# Patient Record
Sex: Female | Born: 1975 | Race: White | Hispanic: No | Marital: Married | Smoking: Never smoker
Health system: Southern US, Community
[De-identification: ages and names within clinical notes are randomized; demographics above are authoritative.]

## PROBLEM LIST (undated history)

## (undated) DIAGNOSIS — E785 Hyperlipidemia, unspecified: Secondary | ICD-10-CM

## (undated) DIAGNOSIS — I10 Essential (primary) hypertension: Secondary | ICD-10-CM

## (undated) DIAGNOSIS — J302 Other seasonal allergic rhinitis: Secondary | ICD-10-CM

## (undated) HISTORY — DX: Essential (primary) hypertension: I10

## (undated) HISTORY — DX: Other seasonal allergic rhinitis: J30.2

## (undated) HISTORY — DX: Hyperlipidemia, unspecified: E78.5

---

## 2009-08-13 ENCOUNTER — Ambulatory Visit: Payer: Self-pay | Admitting: Oncology

## 2009-08-18 LAB — CMP (CANCER CENTER ONLY)
ALT(SGPT): 34 U/L (ref 10–47)
Alkaline Phosphatase: 45 U/L (ref 26–84)
CO2: 31 mEq/L (ref 18–33)
Chloride: 104 mEq/L (ref 98–108)
Creat: 0.6 mg/dl (ref 0.6–1.2)
Sodium: 141 mEq/L (ref 128–145)

## 2009-08-18 LAB — CBC WITH DIFFERENTIAL (CANCER CENTER ONLY)
HCT: 41 % (ref 34.8–46.6)
MCV: 91 fL (ref 81–101)
MONO%: 6.7 % (ref 0.0–13.0)
NEUT#: 2.7 10*3/uL (ref 1.5–6.5)
NEUT%: 64.7 % (ref 39.6–80.0)
RDW: 10.3 % — ABNORMAL LOW (ref 10.5–14.6)
WBC: 4.2 10*3/uL (ref 3.9–10.0)

## 2009-08-18 LAB — PROTIME-INR (CHCC SATELLITE): INR: 0.9 — ABNORMAL LOW (ref 2.0–3.5)

## 2009-08-21 LAB — HYPERCOAGULABLE PANEL, COMPREHENSIVE RET.
Anticardiolipin IgM: 3 MPL U/mL (ref <10)
Beta-2 Glyco I IgG: <3 U/mL (ref <=15)
DRVVT: 36.6 secs (ref 34.7–40.5)
PTT Lupus Anticoagulant: 50.8 secs — ABNORMAL HIGH (ref 32.0–43.4)
PTTLA 4:1 Mix: 43.4 secs (ref 36.3–48.8)
Protein S Ag, Total: 77 % (ref 70–140)

## 2009-09-11 ENCOUNTER — Ambulatory Visit: Payer: Self-pay | Admitting: Oncology

## 2009-09-16 LAB — PROTEIN S ACTIVITY: Protein S Activity: 64 % — ABNORMAL LOW (ref 69–129)

## 2010-02-12 ENCOUNTER — Other Ambulatory Visit: Admission: RE | Admit: 2010-02-12 | Discharge: 2010-02-12 | Payer: Self-pay | Admitting: Gynecology

## 2010-02-12 ENCOUNTER — Ambulatory Visit: Payer: Self-pay | Admitting: Gynecology

## 2010-02-12 ENCOUNTER — Emergency Department (HOSPITAL_COMMUNITY): Admission: EM | Admit: 2010-02-12 | Discharge: 2010-02-12 | Payer: Self-pay | Admitting: Emergency Medicine

## 2010-02-23 ENCOUNTER — Ambulatory Visit: Payer: Self-pay | Admitting: Gynecology

## 2010-04-17 ENCOUNTER — Encounter: Admission: RE | Admit: 2010-04-17 | Discharge: 2010-04-17 | Payer: Self-pay | Admitting: Gynecology

## 2015-03-28 ENCOUNTER — Encounter: Payer: Self-pay | Admitting: Obstetrics and Gynecology

## 2015-03-31 DIAGNOSIS — E785 Hyperlipidemia, unspecified: Secondary | ICD-10-CM | POA: Insufficient documentation

## 2015-03-31 DIAGNOSIS — N943 Premenstrual tension syndrome: Secondary | ICD-10-CM | POA: Insufficient documentation

## 2015-03-31 DIAGNOSIS — Z8249 Family history of ischemic heart disease and other diseases of the circulatory system: Secondary | ICD-10-CM | POA: Insufficient documentation

## 2015-03-31 DIAGNOSIS — R5383 Other fatigue: Secondary | ICD-10-CM | POA: Insufficient documentation

## 2015-06-06 ENCOUNTER — Encounter: Payer: Self-pay | Admitting: Obstetrics and Gynecology

## 2015-06-18 ENCOUNTER — Encounter: Payer: Self-pay | Admitting: Obstetrics and Gynecology

## 2015-07-28 ENCOUNTER — Other Ambulatory Visit: Payer: Self-pay | Admitting: Internal Medicine

## 2015-07-28 DIAGNOSIS — R1011 Right upper quadrant pain: Secondary | ICD-10-CM | POA: Insufficient documentation

## 2015-07-30 ENCOUNTER — Ambulatory Visit
Admission: RE | Admit: 2015-07-30 | Discharge: 2015-07-30 | Disposition: A | Discharge status: Home / Self Care | Payer: BLUE CROSS/BLUE SHIELD | Source: Ambulatory Visit | Attending: Internal Medicine | Admitting: Internal Medicine

## 2015-07-30 DIAGNOSIS — R1011 Right upper quadrant pain: Secondary | ICD-10-CM

## 2015-10-31 ENCOUNTER — Other Ambulatory Visit: Payer: Self-pay | Admitting: Gynecology

## 2015-10-31 DIAGNOSIS — R590 Localized enlarged lymph nodes: Secondary | ICD-10-CM

## 2015-11-03 ENCOUNTER — Other Ambulatory Visit: Payer: Self-pay | Admitting: Gynecology

## 2015-11-03 DIAGNOSIS — R590 Localized enlarged lymph nodes: Secondary | ICD-10-CM

## 2015-11-07 ENCOUNTER — Ambulatory Visit
Admission: RE | Admit: 2015-11-07 | Discharge: 2015-11-07 | Disposition: A | Discharge status: Home / Self Care | Payer: BLUE CROSS/BLUE SHIELD | Source: Ambulatory Visit | Attending: Gynecology | Admitting: Gynecology

## 2015-11-07 ENCOUNTER — Other Ambulatory Visit: Payer: Self-pay | Admitting: Gynecology

## 2015-11-07 DIAGNOSIS — R590 Localized enlarged lymph nodes: Secondary | ICD-10-CM

## 2015-11-12 ENCOUNTER — Other Ambulatory Visit: Payer: Self-pay | Admitting: Gynecology

## 2015-11-12 ENCOUNTER — Ambulatory Visit
Admission: RE | Admit: 2015-11-12 | Discharge: 2015-11-12 | Disposition: A | Discharge status: Home / Self Care | Payer: BLUE CROSS/BLUE SHIELD | Source: Ambulatory Visit | Attending: Gynecology | Admitting: Gynecology

## 2015-11-12 DIAGNOSIS — R590 Localized enlarged lymph nodes: Secondary | ICD-10-CM

## 2016-02-09 DIAGNOSIS — R059 Cough, unspecified: Secondary | ICD-10-CM | POA: Insufficient documentation

## 2016-06-11 ENCOUNTER — Other Ambulatory Visit: Payer: Self-pay | Admitting: Gynecology

## 2016-06-11 DIAGNOSIS — N63 Unspecified lump in unspecified breast: Secondary | ICD-10-CM

## 2016-06-18 ENCOUNTER — Ambulatory Visit
Admission: RE | Admit: 2016-06-18 | Discharge: 2016-06-18 | Disposition: A | Discharge status: Home / Self Care | Payer: BLUE CROSS/BLUE SHIELD | Source: Ambulatory Visit | Attending: Gynecology | Admitting: Gynecology

## 2016-06-18 ENCOUNTER — Other Ambulatory Visit: Payer: Self-pay | Admitting: Gynecology

## 2016-06-18 DIAGNOSIS — N63 Unspecified lump in unspecified breast: Secondary | ICD-10-CM

## 2016-06-22 ENCOUNTER — Other Ambulatory Visit: Payer: BLUE CROSS/BLUE SHIELD

## 2016-06-23 ENCOUNTER — Other Ambulatory Visit (HOSPITAL_COMMUNITY): Payer: Self-pay | Admitting: Obstetrics and Gynecology

## 2016-06-23 DIAGNOSIS — R1011 Right upper quadrant pain: Secondary | ICD-10-CM

## 2016-06-28 ENCOUNTER — Ambulatory Visit (HOSPITAL_COMMUNITY)
Admission: RE | Admit: 2016-06-28 | Discharge: 2016-06-28 | Disposition: A | Discharge status: Home / Self Care | Payer: BLUE CROSS/BLUE SHIELD | Source: Ambulatory Visit | Attending: Obstetrics and Gynecology | Admitting: Obstetrics and Gynecology

## 2016-06-28 DIAGNOSIS — R1011 Right upper quadrant pain: Secondary | ICD-10-CM | POA: Diagnosis present

## 2016-08-23 DIAGNOSIS — Z0000 Encounter for general adult medical examination without abnormal findings: Secondary | ICD-10-CM | POA: Insufficient documentation

## 2016-08-23 DIAGNOSIS — M25519 Pain in unspecified shoulder: Secondary | ICD-10-CM | POA: Insufficient documentation

## 2016-10-06 DIAGNOSIS — S86812A Strain of other muscle(s) and tendon(s) at lower leg level, left leg, initial encounter: Secondary | ICD-10-CM | POA: Diagnosis not present

## 2017-03-09 DIAGNOSIS — M79604 Pain in right leg: Secondary | ICD-10-CM | POA: Diagnosis not present

## 2017-03-09 DIAGNOSIS — M25511 Pain in right shoulder: Secondary | ICD-10-CM | POA: Diagnosis not present

## 2017-03-09 DIAGNOSIS — M6281 Muscle weakness (generalized): Secondary | ICD-10-CM | POA: Diagnosis not present

## 2017-03-09 DIAGNOSIS — M79605 Pain in left leg: Secondary | ICD-10-CM | POA: Diagnosis not present

## 2017-03-14 DIAGNOSIS — M79605 Pain in left leg: Secondary | ICD-10-CM | POA: Diagnosis not present

## 2017-03-14 DIAGNOSIS — M25511 Pain in right shoulder: Secondary | ICD-10-CM | POA: Diagnosis not present

## 2017-03-14 DIAGNOSIS — M6281 Muscle weakness (generalized): Secondary | ICD-10-CM | POA: Diagnosis not present

## 2017-03-14 DIAGNOSIS — M79604 Pain in right leg: Secondary | ICD-10-CM | POA: Diagnosis not present

## 2017-03-22 DIAGNOSIS — M25511 Pain in right shoulder: Secondary | ICD-10-CM | POA: Diagnosis not present

## 2017-03-22 DIAGNOSIS — M6281 Muscle weakness (generalized): Secondary | ICD-10-CM | POA: Diagnosis not present

## 2017-03-22 DIAGNOSIS — M79605 Pain in left leg: Secondary | ICD-10-CM | POA: Diagnosis not present

## 2017-03-22 DIAGNOSIS — M79604 Pain in right leg: Secondary | ICD-10-CM | POA: Diagnosis not present

## 2017-03-29 DIAGNOSIS — M79605 Pain in left leg: Secondary | ICD-10-CM | POA: Diagnosis not present

## 2017-03-29 DIAGNOSIS — M25511 Pain in right shoulder: Secondary | ICD-10-CM | POA: Diagnosis not present

## 2017-03-29 DIAGNOSIS — M6281 Muscle weakness (generalized): Secondary | ICD-10-CM | POA: Diagnosis not present

## 2017-03-29 DIAGNOSIS — M79604 Pain in right leg: Secondary | ICD-10-CM | POA: Diagnosis not present

## 2017-05-03 DIAGNOSIS — M79605 Pain in left leg: Secondary | ICD-10-CM | POA: Diagnosis not present

## 2017-05-03 DIAGNOSIS — M25511 Pain in right shoulder: Secondary | ICD-10-CM | POA: Diagnosis not present

## 2017-05-03 DIAGNOSIS — M79604 Pain in right leg: Secondary | ICD-10-CM | POA: Diagnosis not present

## 2017-05-03 DIAGNOSIS — M6281 Muscle weakness (generalized): Secondary | ICD-10-CM | POA: Diagnosis not present

## 2017-06-24 DIAGNOSIS — Z1389 Encounter for screening for other disorder: Secondary | ICD-10-CM | POA: Diagnosis not present

## 2017-06-24 DIAGNOSIS — Z1231 Encounter for screening mammogram for malignant neoplasm of breast: Secondary | ICD-10-CM | POA: Diagnosis not present

## 2017-06-24 DIAGNOSIS — Z01419 Encounter for gynecological examination (general) (routine) without abnormal findings: Secondary | ICD-10-CM | POA: Diagnosis not present

## 2017-06-24 DIAGNOSIS — Z6821 Body mass index (BMI) 21.0-21.9, adult: Secondary | ICD-10-CM | POA: Diagnosis not present

## 2017-08-08 DIAGNOSIS — M79604 Pain in right leg: Secondary | ICD-10-CM | POA: Diagnosis not present

## 2017-08-08 DIAGNOSIS — M6281 Muscle weakness (generalized): Secondary | ICD-10-CM | POA: Diagnosis not present

## 2017-08-08 DIAGNOSIS — M79605 Pain in left leg: Secondary | ICD-10-CM | POA: Diagnosis not present

## 2017-08-10 DIAGNOSIS — M6281 Muscle weakness (generalized): Secondary | ICD-10-CM | POA: Diagnosis not present

## 2017-08-10 DIAGNOSIS — M79605 Pain in left leg: Secondary | ICD-10-CM | POA: Diagnosis not present

## 2017-08-10 DIAGNOSIS — M79604 Pain in right leg: Secondary | ICD-10-CM | POA: Diagnosis not present

## 2017-08-12 DIAGNOSIS — M6281 Muscle weakness (generalized): Secondary | ICD-10-CM | POA: Diagnosis not present

## 2017-08-12 DIAGNOSIS — M79605 Pain in left leg: Secondary | ICD-10-CM | POA: Diagnosis not present

## 2017-08-12 DIAGNOSIS — M79604 Pain in right leg: Secondary | ICD-10-CM | POA: Diagnosis not present

## 2017-08-16 ENCOUNTER — Encounter: Payer: Self-pay | Admitting: Sports Medicine

## 2017-08-16 ENCOUNTER — Ambulatory Visit (INDEPENDENT_AMBULATORY_CARE_PROVIDER_SITE_OTHER): Payer: BLUE CROSS/BLUE SHIELD | Admitting: Sports Medicine

## 2017-08-16 ENCOUNTER — Ambulatory Visit: Payer: Self-pay

## 2017-08-16 VITALS — BP 100/70 | Ht 66.0 in | Wt 137.0 lb

## 2017-08-16 DIAGNOSIS — M25561 Pain in right knee: Secondary | ICD-10-CM | POA: Insufficient documentation

## 2017-08-16 DIAGNOSIS — M25562 Pain in left knee: Principal | ICD-10-CM

## 2017-08-16 NOTE — Assessment & Plan Note (Signed)
See OV  HEP  ressurance  Some periodic icing  Strap prn

## 2017-08-16 NOTE — Progress Notes (Signed)
   Subjective:    Patient ID: Mallory Ramirez, female    DOB: 1976/09/12, 41 y.o.   MRN: [id number]  HPI 41 yo avid [place] female runner presents today to discuss her bilateral knee pain x 1 month. Pt has been running for decades and participated in several distance races. Most recently she began training for the [employer] run set to take place 08/27/17. She has been running ~25-30 miles a week. Over the past month she developed bilateral anterior dull achy knee pain at the 10 mile mark. She reports stopping training and hobbling off the course do to pain. She has iced, elevated, and rested the knees. Her right knee seems to have improved with the R.I.C.E regimen but the pain in the left knee remains. She is afraid to train and seeking advice today.    Review of Systems Musculoskeletal: No weakness/ no lockng/ no giving way/ no swelling Neurovascular: Denies numbness, pallor, or paresthesias     Objective:   Physical Exam  BP 100/70   Ht  (1.676 m)   Wt 137 lb (62.1 kg)   BMI 22.11 kg/m   Gen: no apparent distress, pleasant Resp: no labored breathing Gait: Neutral strike, mild pronation left Bilateral knee:  Inspection: no effusion, bruising, rashes, or lesions noted, left patella lays slightly higher and lateral when compared to the right Palpation: mild tenderness over the left tibial plateau, no tenderness over suprapatellar, patella, infrapatella, joint lines, MCL, LCL, or popliteal region ROM: -5 extension, +135 degree flexion, 30 degree internal rotation of hip, 45 degree external rotation of hip Strength: 5/5 strength on knee extension/flexion, hip adduction.abduction, & gluteus muscles Neurovascular: sensation and pulses normal throughout Special testing: Negative valgus/varus stress, Lachman's, Drawer, Pivot shift, bounce, McMurray, & Noble's.     Ultrasound Left knee Suprapatellar region: no effusion, normal quad tendon Infrapatellar: no effusion, normal patellar  tendon Medial meniscus: intact no thinning noted, no osteophytes noted; normal Lateral meniscus: intact no thinning noted, no osteophytes noted; normal Trochlear groove appears with good cartilage and no abnormality bilaterally Findings on RT were normal as well by comparison views  Impression: Ultrasound does not show any significant abnormalities  Ultrasound and interpretation by Sibyl Parr. Caitland Porchia, MD      Assessment & Plan:  Left knee pain No abnormalities noted on ultrasound today that would sideline her running. However, she would benefit from quad strengthening exercises. Pt instructed and educated on exercises to perform to help strength her quads. Specifically decline squats and one-leg squats, both to 30 degrees of knee flexion as that is the amount of flexion she is likely to get on her runs. Pt was advised to ease back into her runs and to use her pain as a guide. Return to clinic as needed if pain does not resolve.   Try patellar strap on left for comfort if she wants to race soon

## 2017-08-17 DIAGNOSIS — M79605 Pain in left leg: Secondary | ICD-10-CM | POA: Diagnosis not present

## 2017-08-17 DIAGNOSIS — M6281 Muscle weakness (generalized): Secondary | ICD-10-CM | POA: Diagnosis not present

## 2017-08-17 DIAGNOSIS — M79604 Pain in right leg: Secondary | ICD-10-CM | POA: Diagnosis not present

## 2017-09-10 DIAGNOSIS — Z23 Encounter for immunization: Secondary | ICD-10-CM | POA: Diagnosis not present

## 2017-09-14 DIAGNOSIS — M79604 Pain in right leg: Secondary | ICD-10-CM | POA: Diagnosis not present

## 2017-09-14 DIAGNOSIS — M6281 Muscle weakness (generalized): Secondary | ICD-10-CM | POA: Diagnosis not present

## 2017-09-14 DIAGNOSIS — M79605 Pain in left leg: Secondary | ICD-10-CM | POA: Diagnosis not present

## 2017-09-19 DIAGNOSIS — M6281 Muscle weakness (generalized): Secondary | ICD-10-CM | POA: Diagnosis not present

## 2017-09-19 DIAGNOSIS — M79605 Pain in left leg: Secondary | ICD-10-CM | POA: Diagnosis not present

## 2017-09-19 DIAGNOSIS — M79604 Pain in right leg: Secondary | ICD-10-CM | POA: Diagnosis not present

## 2017-09-26 DIAGNOSIS — M79604 Pain in right leg: Secondary | ICD-10-CM | POA: Diagnosis not present

## 2017-09-26 DIAGNOSIS — M6281 Muscle weakness (generalized): Secondary | ICD-10-CM | POA: Diagnosis not present

## 2017-09-26 DIAGNOSIS — M79605 Pain in left leg: Secondary | ICD-10-CM | POA: Diagnosis not present

## 2017-10-03 DIAGNOSIS — M79604 Pain in right leg: Secondary | ICD-10-CM | POA: Diagnosis not present

## 2017-10-03 DIAGNOSIS — M6281 Muscle weakness (generalized): Secondary | ICD-10-CM | POA: Diagnosis not present

## 2017-10-03 DIAGNOSIS — M79605 Pain in left leg: Secondary | ICD-10-CM | POA: Diagnosis not present

## 2017-10-10 DIAGNOSIS — Z0000 Encounter for general adult medical examination without abnormal findings: Secondary | ICD-10-CM | POA: Diagnosis not present

## 2017-10-10 DIAGNOSIS — R82998 Other abnormal findings in urine: Secondary | ICD-10-CM | POA: Diagnosis not present

## 2017-10-10 DIAGNOSIS — E785 Hyperlipidemia, unspecified: Secondary | ICD-10-CM | POA: Diagnosis not present

## 2017-10-11 DIAGNOSIS — M6281 Muscle weakness (generalized): Secondary | ICD-10-CM | POA: Diagnosis not present

## 2017-10-11 DIAGNOSIS — M79604 Pain in right leg: Secondary | ICD-10-CM | POA: Diagnosis not present

## 2017-10-11 DIAGNOSIS — M79605 Pain in left leg: Secondary | ICD-10-CM | POA: Diagnosis not present

## 2017-10-17 DIAGNOSIS — R3121 Asymptomatic microscopic hematuria: Secondary | ICD-10-CM | POA: Insufficient documentation

## 2018-01-23 DIAGNOSIS — M79604 Pain in right leg: Secondary | ICD-10-CM | POA: Diagnosis not present

## 2018-01-23 DIAGNOSIS — M79601 Pain in right arm: Secondary | ICD-10-CM | POA: Diagnosis not present

## 2018-02-06 DIAGNOSIS — M79601 Pain in right arm: Secondary | ICD-10-CM | POA: Diagnosis not present

## 2018-02-06 DIAGNOSIS — M79604 Pain in right leg: Secondary | ICD-10-CM | POA: Diagnosis not present

## 2018-02-09 DIAGNOSIS — M79601 Pain in right arm: Secondary | ICD-10-CM | POA: Diagnosis not present

## 2018-02-09 DIAGNOSIS — M79604 Pain in right leg: Secondary | ICD-10-CM | POA: Diagnosis not present

## 2018-03-01 DIAGNOSIS — M79604 Pain in right leg: Secondary | ICD-10-CM | POA: Diagnosis not present

## 2018-03-01 DIAGNOSIS — M79601 Pain in right arm: Secondary | ICD-10-CM | POA: Diagnosis not present

## 2018-03-28 DIAGNOSIS — R1032 Left lower quadrant pain: Secondary | ICD-10-CM | POA: Diagnosis not present

## 2018-03-28 DIAGNOSIS — Z6821 Body mass index (BMI) 21.0-21.9, adult: Secondary | ICD-10-CM | POA: Diagnosis not present

## 2018-04-04 DIAGNOSIS — R1032 Left lower quadrant pain: Secondary | ICD-10-CM | POA: Diagnosis not present

## 2018-04-05 ENCOUNTER — Other Ambulatory Visit: Payer: Self-pay | Admitting: Internal Medicine

## 2018-04-05 ENCOUNTER — Ambulatory Visit
Admission: RE | Admit: 2018-04-05 | Discharge: 2018-04-05 | Disposition: A | Discharge status: Home / Self Care | Payer: BLUE CROSS/BLUE SHIELD | Source: Ambulatory Visit | Attending: Internal Medicine | Admitting: Internal Medicine

## 2018-04-05 DIAGNOSIS — R1032 Left lower quadrant pain: Secondary | ICD-10-CM | POA: Diagnosis not present

## 2018-04-05 MED ORDER — IOPAMIDOL (ISOVUE-300) INJECTION 61%
100.0000 mL | Freq: Once | INTRAVENOUS | Status: AC | PRN
  Administered 2018-04-05: 100 mL via INTRAVENOUS

## 2018-04-07 DIAGNOSIS — R102 Pelvic and perineal pain: Secondary | ICD-10-CM | POA: Diagnosis not present

## 2018-05-02 DIAGNOSIS — Z01419 Encounter for gynecological examination (general) (routine) without abnormal findings: Secondary | ICD-10-CM | POA: Diagnosis not present

## 2018-05-02 DIAGNOSIS — Z6822 Body mass index (BMI) 22.0-22.9, adult: Secondary | ICD-10-CM | POA: Diagnosis not present

## 2018-06-20 DIAGNOSIS — M6281 Muscle weakness (generalized): Secondary | ICD-10-CM | POA: Diagnosis not present

## 2018-06-20 DIAGNOSIS — M79604 Pain in right leg: Secondary | ICD-10-CM | POA: Diagnosis not present

## 2018-09-19 DIAGNOSIS — S93402D Sprain of unspecified ligament of left ankle, subsequent encounter: Secondary | ICD-10-CM | POA: Diagnosis not present

## 2018-09-20 DIAGNOSIS — S93402D Sprain of unspecified ligament of left ankle, subsequent encounter: Secondary | ICD-10-CM | POA: Diagnosis not present

## 2018-09-23 IMAGING — CT CT ABD-PELV W/ CM
1 of 3 series · 14 of 32 positions shown, 19 images · IV contrast (APPLIED)
Comparison: None.

CLINICAL DATA: Left lower quadrant abdominal pain

EXAM:
CT ABDOMEN AND PELVIS WITH CONTRAST
TECHNIQUE: Multidetector CT imaging of the abdomen and pelvis was performed
using the standard protocol following bolus administration of
intravenous contrast.
CONTRAST:  100mL 8VRDHX-B00 IOPAMIDOL (8VRDHX-B00) INJECTION 61%

[Series 2: abd/pelvis w/cm · axial · 0.77mm/px · z∈[-426,-31]mm · 14 of 91 slices shown, 19 images]
[im 6/91  soft-tissue]
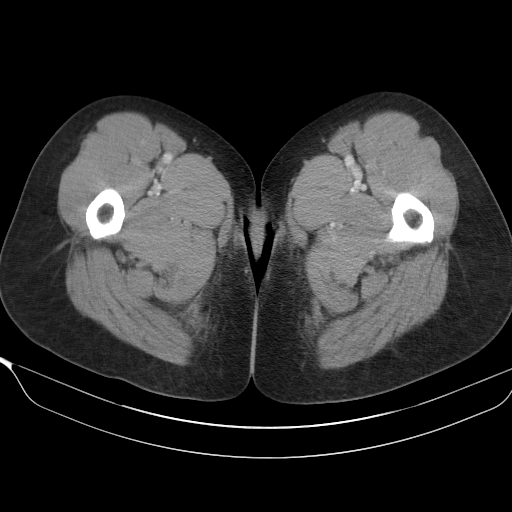
[im 6/91  bone]
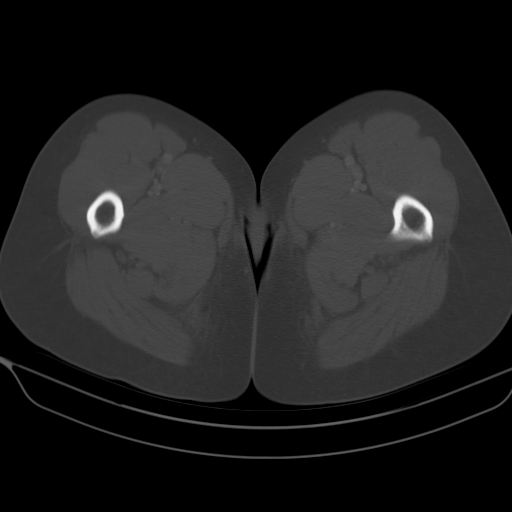
[im 12/91  soft-tissue]
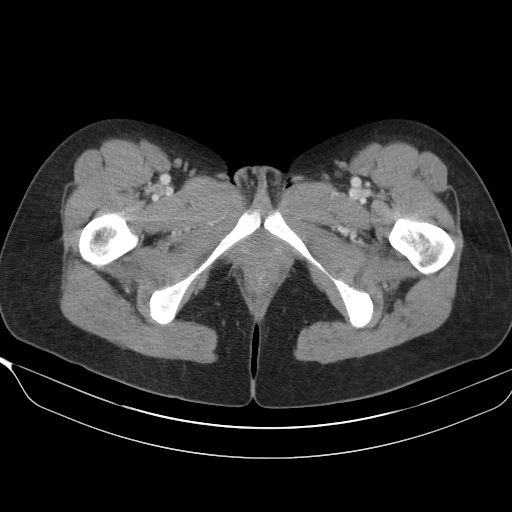
[im 17/91  soft-tissue]
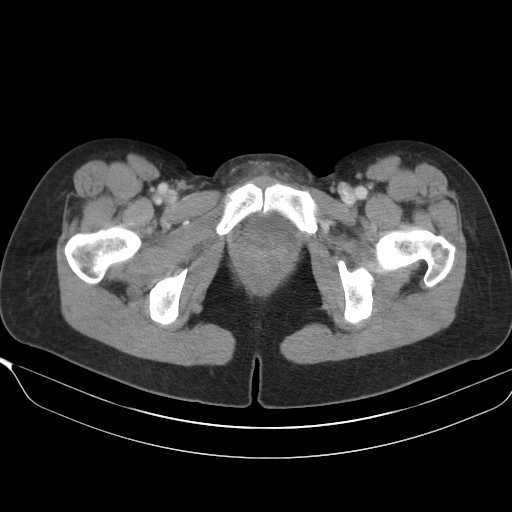
[im 29/91  soft-tissue]
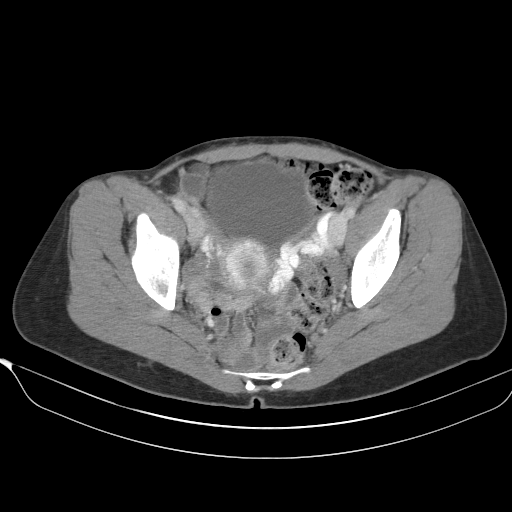
[im 34/91  soft-tissue]
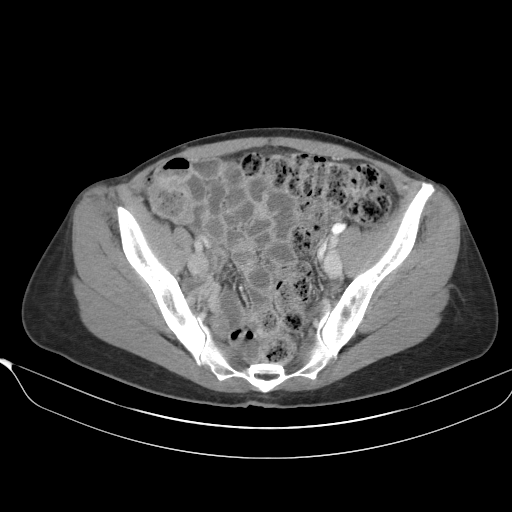
[im 40/91  soft-tissue]
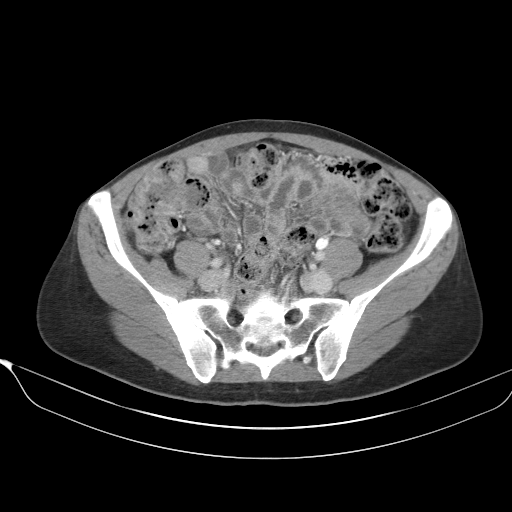
[im 46/91  soft-tissue]
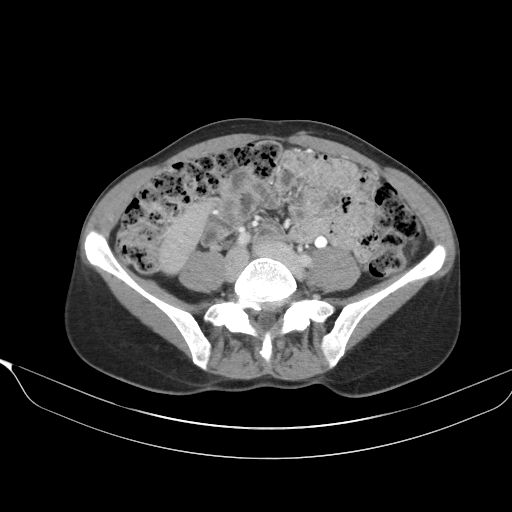
[im 51/91  soft-tissue]
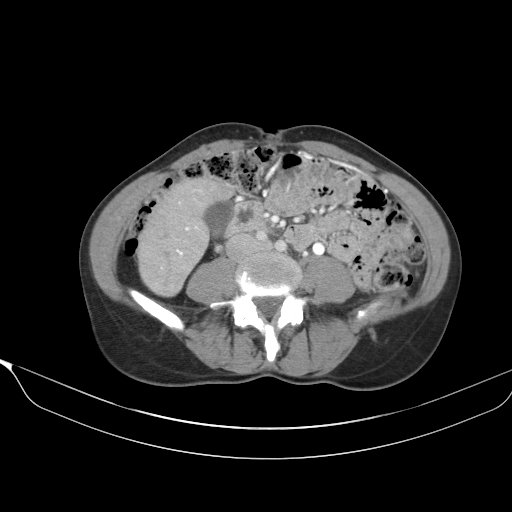
[im 57/91  soft-tissue]
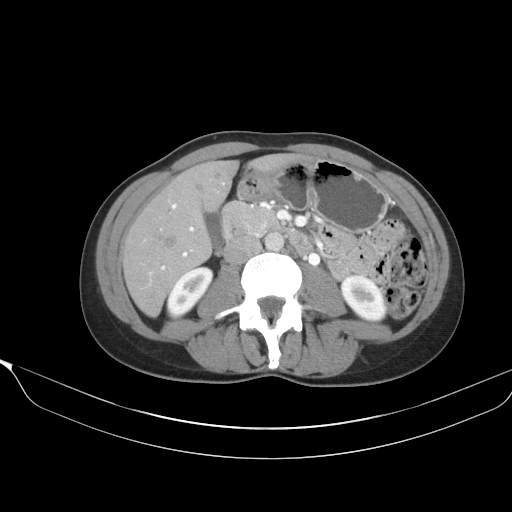
[im 57/91  bone]
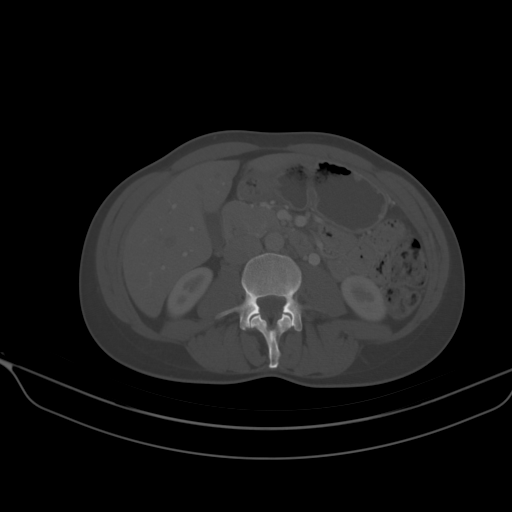
[im 62/91  soft-tissue]
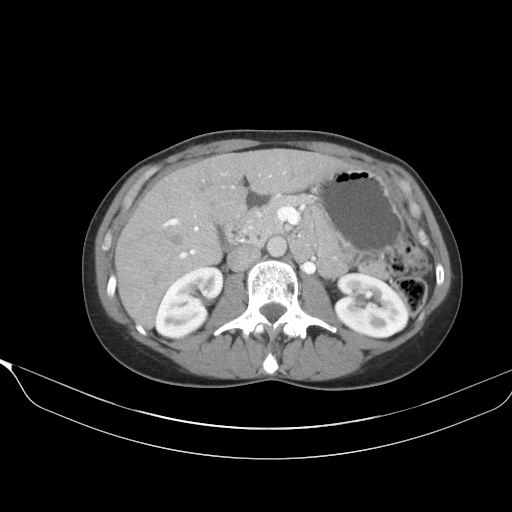
[im 68/91  lung]
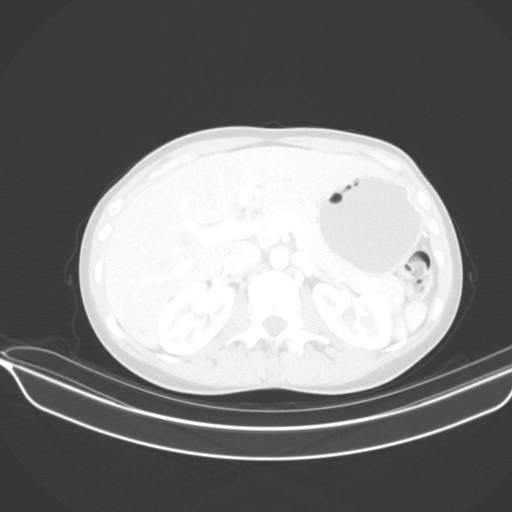
[im 74/91  soft-tissue]
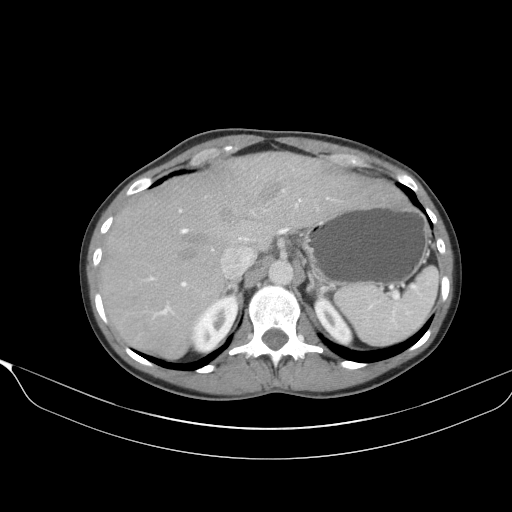
[im 74/91  lung]
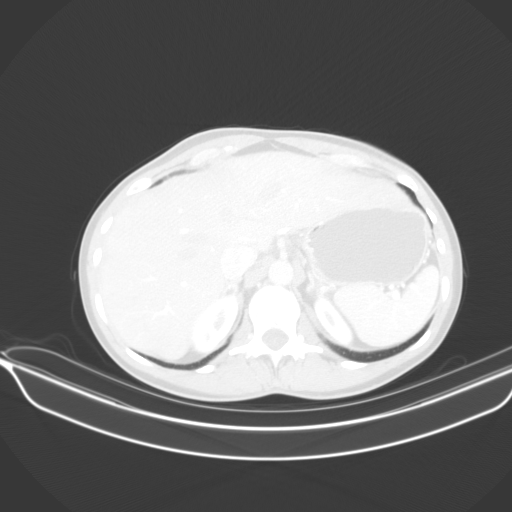
[im 79/91  soft-tissue]
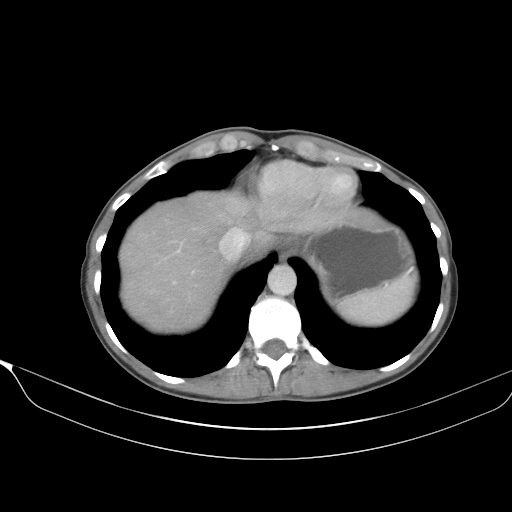
[im 79/91  lung]
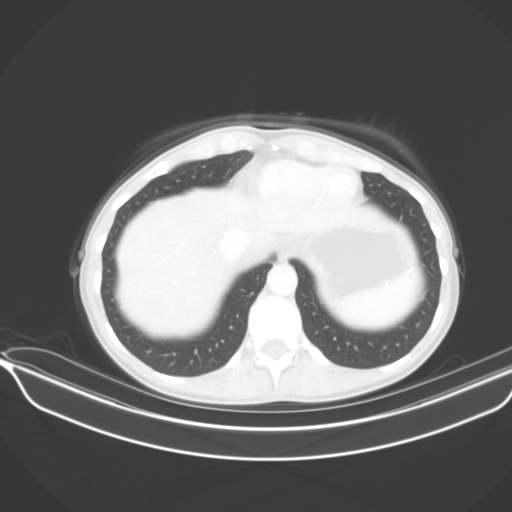
[im 85/91  soft-tissue]
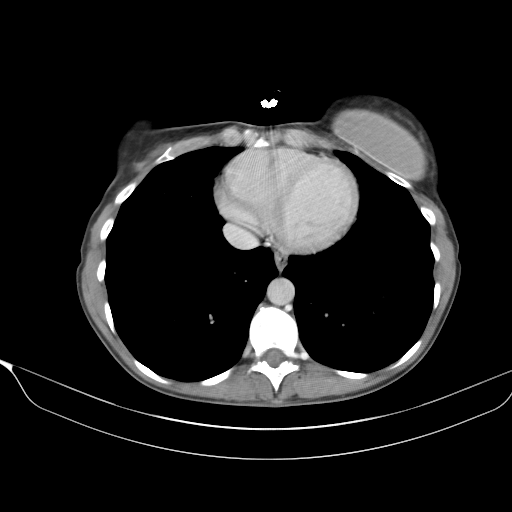
[im 85/91  lung]
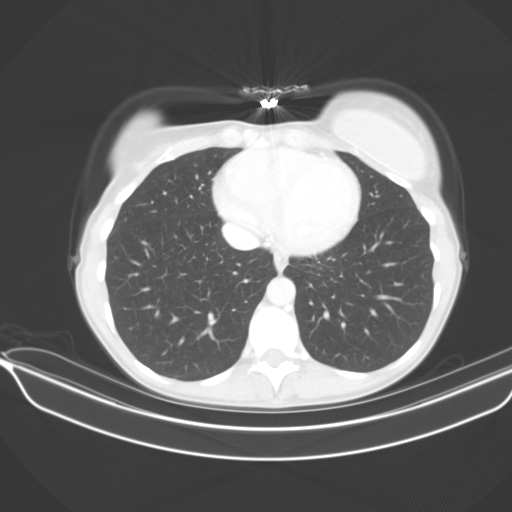

[14 of 32 positions shown; findings below may reference images not displayed]

FINDINGS: Lower chest: Negative

Hepatobiliary: Normal liver.  Gallbladder and bile ducts normal.

Pancreas: Negative

Spleen: Negative

Adrenals/Urinary Tract: Normal kidneys. No renal mass or obstruction
or stone. Normal bladder.

Stomach/Bowel: Moderate to large amount of stool in the colon.
Negative for bowel obstruction. Negative for bowel mass or edema.
Appendix not visualized. Negative for diverticulitis.

Vascular/Lymphatic: Prominent left gonadal vein, measuring 10 mm in
diameter.. Prominent adnexal vessels, left greater than right.
Negative for atherosclerotic disease. No adenopathy.

Reproductive: No pelvic mass.

Other: No free fluid

Musculoskeletal: No acute abnormality. Central disc protrusion
L5-S1.
IMPRESSION: 1. Large amount of stool in the colon without bowel obstruction or
bowel edema
2. Pelvic venous congestion with dilated adnexal veins left greater
than right and enlarged left gonadal vein.

## 2018-09-26 DIAGNOSIS — S93402D Sprain of unspecified ligament of left ankle, subsequent encounter: Secondary | ICD-10-CM | POA: Diagnosis not present

## 2018-09-28 ENCOUNTER — Ambulatory Visit (INDEPENDENT_AMBULATORY_CARE_PROVIDER_SITE_OTHER): Payer: Self-pay

## 2018-09-28 ENCOUNTER — Ambulatory Visit (INDEPENDENT_AMBULATORY_CARE_PROVIDER_SITE_OTHER): Payer: Self-pay | Admitting: Surgery

## 2018-09-28 ENCOUNTER — Encounter (INDEPENDENT_AMBULATORY_CARE_PROVIDER_SITE_OTHER): Payer: Self-pay | Admitting: Surgery

## 2018-09-28 ENCOUNTER — Ambulatory Visit (INDEPENDENT_AMBULATORY_CARE_PROVIDER_SITE_OTHER): Payer: BLUE CROSS/BLUE SHIELD | Admitting: Surgery

## 2018-09-28 DIAGNOSIS — M25572 Pain in left ankle and joints of left foot: Secondary | ICD-10-CM | POA: Diagnosis not present

## 2018-09-28 DIAGNOSIS — S93402D Sprain of unspecified ligament of left ankle, subsequent encounter: Secondary | ICD-10-CM | POA: Diagnosis not present

## 2018-10-05 ENCOUNTER — Ambulatory Visit (INDEPENDENT_AMBULATORY_CARE_PROVIDER_SITE_OTHER): Payer: Self-pay | Admitting: Surgery

## 2018-10-05 DIAGNOSIS — S93402D Sprain of unspecified ligament of left ankle, subsequent encounter: Secondary | ICD-10-CM | POA: Diagnosis not present

## 2018-10-06 DIAGNOSIS — Z23 Encounter for immunization: Secondary | ICD-10-CM | POA: Diagnosis not present

## 2018-10-09 DIAGNOSIS — S93402D Sprain of unspecified ligament of left ankle, subsequent encounter: Secondary | ICD-10-CM | POA: Diagnosis not present

## 2018-10-09 NOTE — Progress Notes (Signed)
Office Visit Note   Patient: Mallory Ramirez           Date of Birth: June 23, 1976           MRN: [id number] Visit Date: 09/28/2018              Requested by: Rodrigo RanPerini, Mark, MD [address] [place], [place] [id number] PCP: Rodrigo RanPerini, Mark, MD   Assessment & Plan: Visit Diagnoses:  1. Pain in left ankle and joints of left foot     Plan: Advised patient that I recommend that she hold off on participating in her distance run that she has upcoming to let her ankle completely healed.  I did advise patient that she could either delay the healing process or cause further injury if she is noncompliant.  She voices understanding.  Can ice off and on as needed and can use over-the-counter NSAID. Marland Kitchen.  She does have a lace up brace that she can continue to use.  I do not think she needs a cam boot at this point.    Follow-Up Instructions: Return in about 2 weeks (around 10/12/2018) for with Khylah Kendra recheck ankle. .   Orders:  Orders Placed This Encounter  Procedures  . XR Ankle Complete Left   No orders of the defined types were placed in this encounter.     Procedures: No procedures performed   Clinical Data: No additional findings.   Subjective: Chief Complaint  Patient presents with  . Left Ankle - Pain    HPI 42 year old female comes in today with complaints of left lateral ankle pain.  Patient had been a runner and while jogging about 2 weeks ago she stepped into a pothole suffered an inversion type injury.  She has been training for marathon the last several months and is due to competing this in 2 days.  She has gone to see a physical therapist here in town that she knows and has had treatment.  She has not seen a healthcare provider for her ankle problem.  He has been trying conservative treatment with lace up ankle brace, icing off and on.  States that she is also continue to run although she does have pain but not as bad as onset. Review of Systems No current cardiac pulmonary GI  GU issues  Objective: Vital Signs: There were no vitals taken for this visit.  Physical Exam  Constitutional: She is oriented to person, place, and time. She appears well-developed and well-nourished. No distress.  HENT:  Head: Normocephalic and atraumatic.  Eyes: Pupils are equal, round, and reactive to light. EOM are normal.  Neck: Normal range of motion.  Pulmonary/Chest: No respiratory distress.  Musculoskeletal:  Gait is minimally antalgic.  Left ankle she does continue to have some residual bruising lateral aspect ankle and lateral foot.  Does have mild swelling around the ATFL where she is also moderately tender.  Ankle ligaments are stable.  Patient is also tender along the peroneal tendon lateral foot.  Achilles tendon nontender.  Neurovascular intact.  Calf nontender.  Neurological: She is alert and oriented to person, place, and time.  Skin: Skin is warm and dry.    Ortho Exam  Specialty Comments:  No specialty comments available.  Imaging: No results found.   PMFS History: Patient Active Problem List   Diagnosis Date Noted  . Knee pain, bilateral 08/16/2017   History reviewed. No pertinent past medical history.  History reviewed. No pertinent family history.  History reviewed. No pertinent surgical history.  Social History   Occupational History  . Not on file  Tobacco Use  . Smoking status: Never Smoker  . Smokeless tobacco: Never Used  Substance and Sexual Activity  . Alcohol use: Not on file  . Drug use: Not on file  . Sexual activity: Not on file

## 2018-10-19 DIAGNOSIS — Z1231 Encounter for screening mammogram for malignant neoplasm of breast: Secondary | ICD-10-CM | POA: Diagnosis not present

## 2018-11-01 DIAGNOSIS — S93402D Sprain of unspecified ligament of left ankle, subsequent encounter: Secondary | ICD-10-CM | POA: Diagnosis not present

## 2018-11-03 DIAGNOSIS — S93402D Sprain of unspecified ligament of left ankle, subsequent encounter: Secondary | ICD-10-CM | POA: Diagnosis not present

## 2018-11-15 HISTORY — PX: ROTATOR CUFF REPAIR: SHX139

## 2018-12-12 DIAGNOSIS — E7849 Other hyperlipidemia: Secondary | ICD-10-CM | POA: Diagnosis not present

## 2018-12-12 DIAGNOSIS — R82998 Other abnormal findings in urine: Secondary | ICD-10-CM | POA: Diagnosis not present

## 2018-12-12 DIAGNOSIS — Z0000 Encounter for general adult medical examination without abnormal findings: Secondary | ICD-10-CM | POA: Diagnosis not present

## 2018-12-20 DIAGNOSIS — E7849 Other hyperlipidemia: Secondary | ICD-10-CM | POA: Diagnosis not present

## 2018-12-20 DIAGNOSIS — N943 Premenstrual tension syndrome: Secondary | ICD-10-CM | POA: Diagnosis not present

## 2018-12-20 DIAGNOSIS — D72819 Decreased white blood cell count, unspecified: Secondary | ICD-10-CM | POA: Insufficient documentation

## 2018-12-20 DIAGNOSIS — L84 Corns and callosities: Secondary | ICD-10-CM | POA: Insufficient documentation

## 2018-12-20 DIAGNOSIS — Z8249 Family history of ischemic heart disease and other diseases of the circulatory system: Secondary | ICD-10-CM | POA: Diagnosis not present

## 2018-12-20 DIAGNOSIS — Z23 Encounter for immunization: Secondary | ICD-10-CM | POA: Diagnosis not present

## 2018-12-20 DIAGNOSIS — Z0000 Encounter for general adult medical examination without abnormal findings: Secondary | ICD-10-CM | POA: Diagnosis not present

## 2018-12-20 DIAGNOSIS — I7300 Raynaud's syndrome without gangrene: Secondary | ICD-10-CM | POA: Insufficient documentation

## 2018-12-20 DIAGNOSIS — Z1331 Encounter for screening for depression: Secondary | ICD-10-CM | POA: Diagnosis not present

## 2018-12-20 DIAGNOSIS — R011 Cardiac murmur, unspecified: Secondary | ICD-10-CM | POA: Insufficient documentation

## 2018-12-20 DIAGNOSIS — M25511 Pain in right shoulder: Secondary | ICD-10-CM | POA: Diagnosis not present

## 2018-12-21 ENCOUNTER — Other Ambulatory Visit (HOSPITAL_COMMUNITY): Payer: Self-pay | Admitting: Internal Medicine

## 2018-12-21 ENCOUNTER — Other Ambulatory Visit: Payer: Self-pay | Admitting: Internal Medicine

## 2018-12-21 DIAGNOSIS — R011 Cardiac murmur, unspecified: Secondary | ICD-10-CM

## 2018-12-25 ENCOUNTER — Ambulatory Visit (HOSPITAL_COMMUNITY): Payer: BLUE CROSS/BLUE SHIELD | Attending: Cardiology

## 2018-12-25 DIAGNOSIS — R011 Cardiac murmur, unspecified: Secondary | ICD-10-CM | POA: Insufficient documentation

## 2019-01-09 ENCOUNTER — Ambulatory Visit: Payer: BLUE CROSS/BLUE SHIELD | Admitting: Sports Medicine

## 2019-01-11 ENCOUNTER — Encounter: Payer: Self-pay | Admitting: Podiatry

## 2019-01-11 ENCOUNTER — Ambulatory Visit: Payer: BLUE CROSS/BLUE SHIELD | Admitting: Podiatry

## 2019-01-11 ENCOUNTER — Ambulatory Visit (INDEPENDENT_AMBULATORY_CARE_PROVIDER_SITE_OTHER): Payer: BLUE CROSS/BLUE SHIELD

## 2019-01-11 VITALS — BP 107/64

## 2019-01-11 DIAGNOSIS — B070 Plantar wart: Secondary | ICD-10-CM

## 2019-01-11 DIAGNOSIS — M79671 Pain in right foot: Secondary | ICD-10-CM | POA: Diagnosis not present

## 2019-01-11 DIAGNOSIS — S90851A Superficial foreign body, right foot, initial encounter: Secondary | ICD-10-CM

## 2019-01-11 NOTE — Patient Instructions (Signed)

## 2019-01-11 NOTE — Progress Notes (Signed)
Subjective:   Patient ID: Mallory Ramirez, female   DOB: 43 y.o.   MRN: [id number]   HPI Patient presents with painful lesion underneath the right big toe and states is been present for about 3 months and it is painful with activity with burning also noted.  Patient does not smoke and likes to be active   Review of Systems  All other systems reviewed and are negative.       Objective:  Physical Exam Vitals signs and nursing note reviewed.  Constitutional:      Appearance: She is well-developed.  Pulmonary:     Effort: Pulmonary effort is normal.  Musculoskeletal: Normal range of motion.  Skin:    General: Skin is warm.  Neurological:     Mental Status: She is alert.     Neurovascular status intact muscle strength is adequate with large lesion plantar aspect right measuring about 2.1 cm x 1.2 cm that upon debridement is painful with lateral pressure especially painful and pinpoint bleeding noted     Assessment:  Verruca plantaris plantar aspect right hallux that is solitary and confined     Plan:  H&P condition reviewed and I recommended surgical excision.  I explained the procedure and risk patient wants surgery and today I went ahead and I anesthetized 60 mg like Marcaine mixture sterile prep applied to the toe and using sterile instrumentation I excised the mass in its entirety I exposed the base and applied a small amount of phenol with sterile dressing and applied thick type dressing to take pressure off the toe.  I did send off for pathological evaluation and instructed on soaks and patient will be seen back in the next several weeks or earlier if any issues should occur

## 2019-03-27 ENCOUNTER — Other Ambulatory Visit: Payer: Self-pay | Admitting: Podiatry

## 2019-03-27 DIAGNOSIS — S90851A Superficial foreign body, right foot, initial encounter: Secondary | ICD-10-CM

## 2019-04-12 DIAGNOSIS — D72819 Decreased white blood cell count, unspecified: Secondary | ICD-10-CM | POA: Diagnosis not present

## 2019-05-07 DIAGNOSIS — M6281 Muscle weakness (generalized): Secondary | ICD-10-CM | POA: Diagnosis not present

## 2019-05-07 DIAGNOSIS — M25571 Pain in right ankle and joints of right foot: Secondary | ICD-10-CM | POA: Diagnosis not present

## 2019-05-07 DIAGNOSIS — M25511 Pain in right shoulder: Secondary | ICD-10-CM | POA: Diagnosis not present

## 2019-05-15 ENCOUNTER — Ambulatory Visit: Payer: BC Managed Care – PPO | Admitting: Sports Medicine

## 2019-05-15 ENCOUNTER — Encounter: Payer: Self-pay | Admitting: Sports Medicine

## 2019-05-15 ENCOUNTER — Other Ambulatory Visit: Payer: Self-pay

## 2019-05-15 ENCOUNTER — Ambulatory Visit: Payer: Self-pay

## 2019-05-15 ENCOUNTER — Encounter

## 2019-05-15 VITALS — BP 110/80 | Ht 66.0 in | Wt 134.0 lb

## 2019-05-15 DIAGNOSIS — S96919A Strain of unspecified muscle and tendon at ankle and foot level, unspecified foot, initial encounter: Secondary | ICD-10-CM | POA: Insufficient documentation

## 2019-05-15 DIAGNOSIS — M25372 Other instability, left ankle: Secondary | ICD-10-CM | POA: Diagnosis not present

## 2019-05-15 DIAGNOSIS — M25511 Pain in right shoulder: Secondary | ICD-10-CM | POA: Diagnosis not present

## 2019-05-15 DIAGNOSIS — M722 Plantar fascial fibromatosis: Secondary | ICD-10-CM | POA: Diagnosis not present

## 2019-05-15 DIAGNOSIS — M75101 Unspecified rotator cuff tear or rupture of right shoulder, not specified as traumatic: Secondary | ICD-10-CM | POA: Diagnosis not present

## 2019-05-15 MED ORDER — NITROGLYCERIN 0.2 MG/HR TD PT24: MEDICATED_PATCH | TRANSDERMAL | 1 refills | Status: DC

## 2019-05-15 NOTE — Assessment & Plan Note (Addendum)
Patient was counseled to continue icing the area for about 10 minutes daily and was given several exercises to stretch the plantar fascia.  She will perform these daily.  She will also use sports insoles for a trail  Ultimately may need custom orthotics

## 2019-05-15 NOTE — Progress Notes (Signed)
Subjective:    Mallory Ramirez - 43 y.o. female MRN [id number]  Date of birth: [birth year]  CC:  Mallory Ramirez is here for chronic right shoulder pain, right foot pain, and left ankle pain.  HPI: Chronic right shoulder pain Patient reports that she played tennis frequently in her teens and thinks that she hurt her shoulder while doing overhead serves at some point.  She has had shoulder pain since her teens and this seems to be getting worse lately.  She has pain doing any overhead activities and struggles to find a comfortable position during the night.  Her pain is located on her anterolateral right shoulder.  She has no neurologic symptoms.  Right foot pain Patient says that she has had right foot pain for about the last 4 weeks with an acute worsening about 1 week ago during a run in which she had to stop and limp back home.  She has some pain when she gets out of bed and stands up each morning, but running exacerbates it the most.  Left ankle pain During the run last year, [name] twisted her left ankle.  She was able to run a marathon about 2 weeks later, but since then she has felt that her ankle has felt less stable than normal, especially in the area posterior to the lateral malleolus.  She will sometimes hear a "pop" but does not feel pain when this occurs.  Health Maintenance:  Health Maintenance Due  Topic Date Due  . HIV Screening  07/28/1989  . TETANUS/TDAP  07/28/1993  . PAP SMEAR-Modifier  07/29/1995    -  reports that she has never smoked. She has never used smokeless tobacco. - Review of Systems: Per HPI. - Past Medical History: Patient Active Problem List   Diagnosis Date Noted  . Knee pain, bilateral 08/16/2017   - Medications: reviewed and updated   Objective:   Physical Exam BP 110/80   Ht 5\' 6"  (1.676 m)   Wt 134 lb (60.8 kg)   BMI 21.63 kg/m  Gen: NAD, alert, cooperative with exam, well-appearing Shoulder, right: No evidence of bony deformity,  asymmetry, or muscle atrophy; Mild tenderness over long head of biceps (bicipital groove). No TTP at [facility] joint. Active and passive ROM is reduced on forward flexion (90 degrees) and abduction (90 degrees) due to pain. Unable to reach spine with thumb. Strength 4/5 on elevation and abduction past 90 degrees but normal on external and internal rotation.  No abnormal scapular function observed. Sensation intact. Peripheral pulses intact.  Special Tests:   - Crossarm test: POS   - Empty can: POS   - Hawkins: POS   - Neer test: POS   - Obrien's test: POS   - Yergason's: NEG   - Speeds test: NEG Right foot: Normal appearance, tenderness to palpation on the plantar aspect of the foot just medial to the midline along the longitudinal arch.  Left ankle: Increased laxity noted on anterior drawer with palpable popping of the subtalar joint  Gait analysis: Right foot lands in supination and has intoeing especially with running.  Gait otherwise normal.  Ultrasound of Right Shoulder  BT short: normal appearance, no tears, no tenosynovitis BT long: normal appearance, intact without tears Supraspinatus tendon: Chronic partial tear noted with associated fluid and calcification This is retracted by 1.6 cms Subscapularis tendon: normal appearance without tears or degenerative changes Infraspinatus tendon:  normal appearance without tears or degenerative changes Teres Minor tendon:  normal appearance without  tears or degenerative changes GH joint:  posterior labrum visualized with slight irregularity AC joint: No degenerative changes  Summary and 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 Fields, MD       Assessment & Plan:   See Tear of supraspinatus   Lezlie OctaveAmanda Winfrey, M.D. 05/15/2019, 10:43 AM PGY-2, [facility]  I observed and examined the patient with the resident and agree with assessment and plan.  Note reviewed  and modified by me. Enid BaasKarl Fields, MD

## 2019-05-15 NOTE — Assessment & Plan Note (Signed)
We will plan to start her on some balance and strength exercises

## 2019-05-15 NOTE — Assessment & Plan Note (Signed)
Patient likely sustained a partial supraspinatus tendon tear when her shoulder began hurting her during her years playing tennis as a teenager.  She may be able to recuperate this with strengthening exercises focusing on the supraspinatus muscle.  Patient was given a prescription for nitroglycerin patch as well and counseled on the most common side effect of headache with use.  Several exercises were described for the patient, and she was given a handout.  These exercises include shoulder flexion and abduction up to 90 degrees with a 3 pound dumbbell, with progression beyond 90 degrees if tolerated after about 2 weeks.  We would like to see her again in about 6 weeks to check her progress.

## 2019-05-15 NOTE — Patient Instructions (Addendum)
It was nice seeing you today Ms. Pedroso!  For your right shoulder pain, please complete 15 repetitions of the shoulder exercises we described to you with 3 pound weights.  Remember to stop at shoulder height and to only go above that in a few weeks if you are able to tolerate it.  We will also try a nitroglycerin patch which she will place on the area of the shoulder that is hurting.  We have described how to use the nitroglycerin patch below.  For your plantar fasciitis, we are giving you inserts to try and exercises to perform, doing 3 sets of 15 of each.  Please do these once daily.  We would like to see you back in about 6 weeks.  If you have any questions or concerns, please feel free to call the clinic.   Be well,  Dr. Oneida Alar  Nitroglycerin Protocol   Apply 1/4 nitroglycerin patch to affected area daily.  Change position of patch within the affected area every 24 hours.  You may experience a headache during the first 1-2 weeks of using the patch, these should subside.  If you experience headaches after beginning nitroglycerin patch treatment, you may take your preferred over the counter pain reliever.  Another side effect of the nitroglycerin patch is skin irritation or rash related to patch adhesive.  Please notify our office if you develop more severe headaches or rash, and stop the patch.  Tendon healing with nitroglycerin patch may require 12 to 24 weeks depending on the extent of injury.  Men should not use if taking Viagra, Cialis, or Levitra.   Do not use if you have migraines or rosacea.

## 2019-05-17 ENCOUNTER — Telehealth: Payer: Self-pay | Admitting: Internal Medicine

## 2019-05-17 NOTE — Telephone Encounter (Signed)
Patient left a voicemail that she is having trouble with some side effects from the treatment you are having her do/meds she is taking.  Please give her a call and let her a call with advice on what to do, thanks.

## 2019-06-04 DIAGNOSIS — Z6821 Body mass index (BMI) 21.0-21.9, adult: Secondary | ICD-10-CM | POA: Diagnosis not present

## 2019-06-04 DIAGNOSIS — N926 Irregular menstruation, unspecified: Secondary | ICD-10-CM | POA: Diagnosis not present

## 2019-06-04 DIAGNOSIS — Z01419 Encounter for gynecological examination (general) (routine) without abnormal findings: Secondary | ICD-10-CM | POA: Diagnosis not present

## 2019-06-04 DIAGNOSIS — E785 Hyperlipidemia, unspecified: Secondary | ICD-10-CM | POA: Diagnosis not present

## 2019-06-26 ENCOUNTER — Other Ambulatory Visit: Payer: Self-pay

## 2019-06-26 ENCOUNTER — Ambulatory Visit (INDEPENDENT_AMBULATORY_CARE_PROVIDER_SITE_OTHER): Payer: BC Managed Care – PPO | Admitting: Sports Medicine

## 2019-06-26 DIAGNOSIS — M75101 Unspecified rotator cuff tear or rupture of right shoulder, not specified as traumatic: Secondary | ICD-10-CM | POA: Diagnosis not present

## 2019-06-26 DIAGNOSIS — S96911D Strain of unspecified muscle and tendon at ankle and foot level, right foot, subsequent encounter: Secondary | ICD-10-CM

## 2019-06-26 DIAGNOSIS — X503XXD Overexertion from repetitive movements, subsequent encounter: Secondary | ICD-10-CM

## 2019-06-26 NOTE — Patient Instructions (Signed)
[facility] Dr Tamera Punt [address] [place] [phone]  Monday 07/02/19 at [place] time is 830a

## 2019-06-26 NOTE — Progress Notes (Signed)
PCP: Crist Infante, MD  Subjective:   HPI: Patient is a 43 y.o. female here for follow-up of right shoulder pain, right plantar pain, and left foot/ankle pains:  Right shoulder pain: Patient was seen  Chronic right shoulder pain Patient reports that she played tennis frequently in her teens and thinks that she hurt her shoulder while doing overhead serves at some point.  She has had shoulder pain since her teens and this seems to be getting worse lately.  She has pain doing any overhead activities and struggles to find a comfortable position during the night.  Her pain is located on her anterolateral right shoulder.  She has no neurologic symptoms.  Right foot pain Patient says that she has had right foot pain for about the last 4 weeks with an acute worsening about 1 week ago during a run in which she had to stop and limp back home.  She has some pain when she gets out of bed and stands up each morning, but running exacerbates it the most.  Left ankle pain During the run last year, Ms. Annell Greening twisted her left ankle.  She was able to run a marathon about 2 weeks later, but since then she has felt that her ankle has felt less stable than normal, especially in the area posterior to the lateral malleolus.  She will sometimes hear a "pop" but does not feel pain when this occurs.  Pertinent ROS Night pain in shoulder most nights Denies neck pain Denies radicular sxs  No past medical history on file.  Current Outpatient Medications on File Prior to Visit  Medication Sig Dispense Refill  . nitroGLYCERIN (NITRODUR - DOSED IN MG/24 HR) 0.2 mg/hr patch Use 1/4 patch daily to the affected area 30 patch 1  . rosuvastatin (CRESTOR) 20 MG tablet rosuvastatin 20 mg tablet  TK 1 T PO QD     No current facility-administered medications on file prior to visit.     No past surgical history on file.  Allergies  Allergen Reactions  . Ibuprofen     Social History   Socioeconomic History  .  Marital status: Married    Spouse name: Not on file  . Number of children: Not on file  . Years of education: Not on file  . Highest education level: Not on file  Occupational History  . Not on file  Social Needs  . Financial resource strain: Not on file  . Food insecurity    Worry: Not on file    Inability: Not on file  . Transportation needs    Medical: Not on file    Non-medical: Not on file  Tobacco Use  . Smoking status: Never Smoker  . Smokeless tobacco: Never Used  Substance and Sexual Activity  . Alcohol use: Not on file  . Drug use: Not on file  . Sexual activity: Not on file  Lifestyle  . Physical activity    Days per week: Not on file    Minutes per session: Not on file  . Stress: Not on file  Relationships  . Social Herbalist on phone: Not on file    Gets together: Not on file    Attends religious service: Not on file    Active member of club or organization: Not on file    Attends meetings of clubs or organizations: Not on file    Relationship status: Not on file  . Intimate partner violence    Fear of current  or ex partner: Not on file    Emotionally abused: Not on file    Physically abused: Not on file    Forced sexual activity: Not on file  Other Topics Concern  . Not on file  Social History Narrative  . Not on file    No family history on file.  BP 100/60   Ht 5\' 7"  (1.702 m)   Wt 128 lb (58.1 kg)   BMI 20.05 kg/m   Review of Systems: See HPI above.     Objective:  Physical Exam:  Gen: NAD, comfortable in exam room  Shoulder: Right Inspection reveals no abnormalities, atrophy or asymmetry. Palpation is normal with no tenderness over AC joint or bicipital groove. ROM is limited for elevation and abduction Limited ROM on back scratch and painful. Rotator cuff strength normal except weak on SST testing Weakness and pain on testing impingement with Neer and Hawkin's tests, empty can. Speeds and Yergason's tests normal. No  labral pathology noted with negative Obrien's, negative clunk and good stability. Normal scapular function observed. + painful arc and has a drop arm sign.if she tries to lower arm quickly No apprehension sign  Foot shows TTP along medial arch Some at medial insertion of PF Long thin foot with cavus shape Some loss of long arch ons tanding Left ankle - pain with peroneal resistance No swelling  US screen No change noted in retracted suprspinatus tendon tear - retracted about 1.5 to 2 cms  Plantar fascia on RT is 0.39 cm and appears normal    Assessment & Plan:  1. Rotator Cuff Tear  2. Gait abnormality with arch strain

## 2019-06-27 NOTE — Assessment & Plan Note (Signed)
She is physically fit and a good candidate for possible RC repair I discussed with patient that [place] and exam are about 90% accurate I suggested a consult with Dr Tamera Punt and see if he thinks surgery indicated Keep up HEP

## 2019-07-02 DIAGNOSIS — M75121 Complete rotator cuff tear or rupture of right shoulder, not specified as traumatic: Secondary | ICD-10-CM | POA: Diagnosis not present

## 2019-07-06 DIAGNOSIS — M25511 Pain in right shoulder: Secondary | ICD-10-CM | POA: Diagnosis not present

## 2019-07-10 ENCOUNTER — Encounter: Payer: Self-pay | Admitting: Sports Medicine

## 2019-07-10 ENCOUNTER — Ambulatory Visit (INDEPENDENT_AMBULATORY_CARE_PROVIDER_SITE_OTHER): Payer: BC Managed Care – PPO | Admitting: Sports Medicine

## 2019-07-10 ENCOUNTER — Other Ambulatory Visit: Payer: Self-pay

## 2019-07-10 VITALS — BP 100/60 | Ht 67.0 in | Wt 128.0 lb

## 2019-07-10 DIAGNOSIS — M722 Plantar fascial fibromatosis: Secondary | ICD-10-CM

## 2019-07-11 ENCOUNTER — Encounter: Payer: Self-pay | Admitting: Sports Medicine

## 2019-07-11 DIAGNOSIS — M75121 Complete rotator cuff tear or rupture of right shoulder, not specified as traumatic: Secondary | ICD-10-CM | POA: Diagnosis not present

## 2019-07-11 NOTE — Progress Notes (Signed)
  Patient comes in today for a second pair of custom orthotics.  She has a well-documented history of plantar fasciitis.  She has had good success with orthotics in the past.  Please see previous office notes for details regarding history and physical exam findings for this patient.  She will follow-up with [place] PRN.  Patient was fitted for a : standard, cushioned, semi-rigid orthotic. The orthotic was heated and afterward the patient stood on the orthotic blank positioned on the orthotic stand. The patient was positioned in subtalar neutral position and 10 degrees of ankle dorsiflexion in a weight bearing stance. After completion of molding, a stable base was applied to the orthotic blank. The blank was ground to a stable position for weight bearing. Size: 10 women's dress orthotic Base: none Posting: none Additional orthotic padding: none

## 2019-07-13 DIAGNOSIS — Z23 Encounter for immunization: Secondary | ICD-10-CM | POA: Diagnosis not present

## 2019-08-07 DIAGNOSIS — G8918 Other acute postprocedural pain: Secondary | ICD-10-CM | POA: Diagnosis not present

## 2019-08-07 DIAGNOSIS — M24111 Other articular cartilage disorders, right shoulder: Secondary | ICD-10-CM | POA: Diagnosis not present

## 2019-08-07 DIAGNOSIS — M75121 Complete rotator cuff tear or rupture of right shoulder, not specified as traumatic: Secondary | ICD-10-CM | POA: Diagnosis not present

## 2019-08-07 DIAGNOSIS — M7541 Impingement syndrome of right shoulder: Secondary | ICD-10-CM | POA: Diagnosis not present

## 2019-08-13 DIAGNOSIS — M75101 Unspecified rotator cuff tear or rupture of right shoulder, not specified as traumatic: Secondary | ICD-10-CM | POA: Diagnosis not present

## 2019-08-13 DIAGNOSIS — M25611 Stiffness of right shoulder, not elsewhere classified: Secondary | ICD-10-CM | POA: Diagnosis not present

## 2019-08-21 DIAGNOSIS — M75101 Unspecified rotator cuff tear or rupture of right shoulder, not specified as traumatic: Secondary | ICD-10-CM | POA: Diagnosis not present

## 2019-08-21 DIAGNOSIS — M25611 Stiffness of right shoulder, not elsewhere classified: Secondary | ICD-10-CM | POA: Diagnosis not present

## 2019-08-28 DIAGNOSIS — M25611 Stiffness of right shoulder, not elsewhere classified: Secondary | ICD-10-CM | POA: Diagnosis not present

## 2019-08-28 DIAGNOSIS — M75101 Unspecified rotator cuff tear or rupture of right shoulder, not specified as traumatic: Secondary | ICD-10-CM | POA: Diagnosis not present

## 2019-09-04 DIAGNOSIS — M75101 Unspecified rotator cuff tear or rupture of right shoulder, not specified as traumatic: Secondary | ICD-10-CM | POA: Diagnosis not present

## 2019-09-04 DIAGNOSIS — M25611 Stiffness of right shoulder, not elsewhere classified: Secondary | ICD-10-CM | POA: Diagnosis not present

## 2019-09-11 DIAGNOSIS — M75101 Unspecified rotator cuff tear or rupture of right shoulder, not specified as traumatic: Secondary | ICD-10-CM | POA: Diagnosis not present

## 2019-09-11 DIAGNOSIS — M25611 Stiffness of right shoulder, not elsewhere classified: Secondary | ICD-10-CM | POA: Diagnosis not present

## 2019-09-18 DIAGNOSIS — M75101 Unspecified rotator cuff tear or rupture of right shoulder, not specified as traumatic: Secondary | ICD-10-CM | POA: Diagnosis not present

## 2019-09-18 DIAGNOSIS — E7849 Other hyperlipidemia: Secondary | ICD-10-CM | POA: Diagnosis not present

## 2019-09-18 DIAGNOSIS — D72819 Decreased white blood cell count, unspecified: Secondary | ICD-10-CM | POA: Diagnosis not present

## 2019-09-18 DIAGNOSIS — Z0000 Encounter for general adult medical examination without abnormal findings: Secondary | ICD-10-CM | POA: Diagnosis not present

## 2019-09-18 DIAGNOSIS — M25611 Stiffness of right shoulder, not elsewhere classified: Secondary | ICD-10-CM | POA: Diagnosis not present

## 2019-09-20 DIAGNOSIS — M75101 Unspecified rotator cuff tear or rupture of right shoulder, not specified as traumatic: Secondary | ICD-10-CM | POA: Diagnosis not present

## 2019-09-20 DIAGNOSIS — M25611 Stiffness of right shoulder, not elsewhere classified: Secondary | ICD-10-CM | POA: Diagnosis not present

## 2019-09-25 DIAGNOSIS — M75101 Unspecified rotator cuff tear or rupture of right shoulder, not specified as traumatic: Secondary | ICD-10-CM | POA: Diagnosis not present

## 2019-09-25 DIAGNOSIS — M25611 Stiffness of right shoulder, not elsewhere classified: Secondary | ICD-10-CM | POA: Diagnosis not present

## 2019-09-27 DIAGNOSIS — M25611 Stiffness of right shoulder, not elsewhere classified: Secondary | ICD-10-CM | POA: Diagnosis not present

## 2019-09-27 DIAGNOSIS — M75101 Unspecified rotator cuff tear or rupture of right shoulder, not specified as traumatic: Secondary | ICD-10-CM | POA: Diagnosis not present

## 2019-10-02 DIAGNOSIS — M75101 Unspecified rotator cuff tear or rupture of right shoulder, not specified as traumatic: Secondary | ICD-10-CM | POA: Diagnosis not present

## 2019-10-02 DIAGNOSIS — M25611 Stiffness of right shoulder, not elsewhere classified: Secondary | ICD-10-CM | POA: Diagnosis not present

## 2019-10-04 DIAGNOSIS — M75101 Unspecified rotator cuff tear or rupture of right shoulder, not specified as traumatic: Secondary | ICD-10-CM | POA: Diagnosis not present

## 2019-10-04 DIAGNOSIS — M25611 Stiffness of right shoulder, not elsewhere classified: Secondary | ICD-10-CM | POA: Diagnosis not present

## 2019-10-08 DIAGNOSIS — M75101 Unspecified rotator cuff tear or rupture of right shoulder, not specified as traumatic: Secondary | ICD-10-CM | POA: Diagnosis not present

## 2019-10-08 DIAGNOSIS — M25611 Stiffness of right shoulder, not elsewhere classified: Secondary | ICD-10-CM | POA: Diagnosis not present

## 2019-10-10 DIAGNOSIS — M25611 Stiffness of right shoulder, not elsewhere classified: Secondary | ICD-10-CM | POA: Diagnosis not present

## 2019-10-10 DIAGNOSIS — M75101 Unspecified rotator cuff tear or rupture of right shoulder, not specified as traumatic: Secondary | ICD-10-CM | POA: Diagnosis not present

## 2019-10-16 DIAGNOSIS — M25611 Stiffness of right shoulder, not elsewhere classified: Secondary | ICD-10-CM | POA: Diagnosis not present

## 2019-10-16 DIAGNOSIS — M75101 Unspecified rotator cuff tear or rupture of right shoulder, not specified as traumatic: Secondary | ICD-10-CM | POA: Diagnosis not present

## 2019-10-17 ENCOUNTER — Ambulatory Visit: Payer: BC Managed Care – PPO | Admitting: Family Medicine

## 2019-10-17 ENCOUNTER — Other Ambulatory Visit: Payer: Self-pay

## 2019-10-17 ENCOUNTER — Encounter: Payer: Self-pay | Admitting: Family Medicine

## 2019-10-17 VITALS — BP 106/70 | Ht 67.0 in | Wt 135.0 lb

## 2019-10-17 DIAGNOSIS — M79604 Pain in right leg: Secondary | ICD-10-CM | POA: Diagnosis not present

## 2019-10-17 DIAGNOSIS — M722 Plantar fascial fibromatosis: Secondary | ICD-10-CM | POA: Diagnosis not present

## 2019-10-17 NOTE — Patient Instructions (Signed)
You have plantar fasciitis Take tylenol and/or aleve as needed for pain  Plantar fascia stretch for 20-30 seconds (do 3 of these) in morning Lowering/raise on a step exercises 3 x 10 once or twice a day - this is very important for long term recovery. Can add heel walks, toe walks forward and backward as well Ice heel for 15 minutes as needed. Avoid flat shoes/barefoot walking as much as possible. Arch straps have been shown to help with pain - try these. Wear orthotics or other arch supports at all times when up and walking around. Steroid injection is a consideration for short term pain relief if you are struggling. Consider seeing Johnston Ebbs to discuss possible active release of this. Some people have success with night splints. Follow up with me in 6 weeks.  Do the home exercises/stretches for your proximal hamstring and hip external rotators.

## 2019-10-17 NOTE — Progress Notes (Signed)
PCP: Crist Infante, MD  Subjective:   HPI: Patient is a 43 y.o. female here for right foot pain.  8/11: Right foot pain Patient says that she has had right foot pain for about the last 4 weeks with an acute worsening about 1 week ago during a run in which she had to stop and limp back home.  She has some pain when she gets out of bed and stands up each morning, but running exacerbates it the most.  12/2: Patient reports her right foot pain has persisted since last visit. She's wearing custom orthotics regularly and doing home exercises about 3 times a week. She did the exercises daily prior to this. Had rotator cuff repair about 10 weeks ago so not back to her usual workout program. Mostly running 3 miles once or twice a week, doing leg work at gym. Pain is plantar right foot, a soreness, feels better when running but worse afterwards. No skin changes, swelling. Also having some pain in right glute area over past few weeks.  History reviewed. No pertinent past medical history.  Current Outpatient Medications on File Prior to Visit  Medication Sig Dispense Refill  . rosuvastatin (CRESTOR) 20 MG tablet rosuvastatin 20 mg tablet  TK 1 T PO QD    . nitroGLYCERIN (NITRODUR - DOSED IN MG/24 HR) 0.2 mg/hr patch Use 1/4 patch daily to the affected area (Patient not taking: Reported on 10/17/2019) 30 patch 1   No current facility-administered medications on file prior to visit.     History reviewed. No pertinent surgical history.  Allergies  Allergen Reactions  . Ibuprofen     Pt doesn't like to take medication.    Social History   Socioeconomic History  . Marital status: Married    Spouse name: Not on file  . Number of children: Not on file  . Years of education: Not on file  . Highest education level: Not on file  Occupational History  . Not on file  Social Needs  . Financial resource strain: Not on file  . Food insecurity    Worry: Not on file    Inability: Not on file   . Transportation needs    Medical: Not on file    Non-medical: Not on file  Tobacco Use  . Smoking status: Never Smoker  . Smokeless tobacco: Never Used  Substance and Sexual Activity  . Alcohol use: Not on file  . Drug use: Not on file  . Sexual activity: Not on file  Lifestyle  . Physical activity    Days per week: Not on file    Minutes per session: Not on file  . Stress: Not on file  Relationships  . Social Herbalist on phone: Not on file    Gets together: Not on file    Attends religious service: Not on file    Active member of club or organization: Not on file    Attends meetings of clubs or organizations: Not on file    Relationship status: Not on file  . Intimate partner violence    Fear of current or ex partner: Not on file    Emotionally abused: Not on file    Physically abused: Not on file    Forced sexual activity: Not on file  Other Topics Concern  . Not on file  Social History Narrative  . Not on file    History reviewed. No pertinent family history.  BP 106/70   Ht 5'  7" (1.702 m)   Wt 135 lb (61.2 kg)   BMI 21.14 kg/m   Review of Systems: See HPI above.     Objective:  Physical Exam:  Gen: NAD, comfortable in exam room  Right foot/ankle: No gross deformity, swelling, ecchymoses FROM with 5/5 strength TTP mild proximal plantar fascia.  No other tenderness Negative ant drawer and talar tilt.   Negative calcaneal squeeze. Thompsons test negative. NV intact distally.  Right hip: No deformity. FROM with 5/5 strength. No tenderness to palpation but pain typically at piriformis and proximal hamstring tendon area. NVI distally. Negative logroll. Negative fadir, faber, piriformis stretch.   Assessment & Plan:  1. Right foot pain - 2/2 plantar fasciitis.  Continue with custom orthotics, home exercises.  Add arch binder.  Icing, tylenol or aleve if needed.  Discussed active release - consider seeing Thereasa Distance for this.  Also  discussed injection, night splint consideration.  F/u in 6 weeks.  2. Right leg pain - consistent with very mild piriformis syndrome, prox hamstring tendinopathy.  Given home exercises to do daily.

## 2019-10-18 DIAGNOSIS — M25611 Stiffness of right shoulder, not elsewhere classified: Secondary | ICD-10-CM | POA: Diagnosis not present

## 2019-10-18 DIAGNOSIS — M75101 Unspecified rotator cuff tear or rupture of right shoulder, not specified as traumatic: Secondary | ICD-10-CM | POA: Diagnosis not present

## 2019-10-23 DIAGNOSIS — M25611 Stiffness of right shoulder, not elsewhere classified: Secondary | ICD-10-CM | POA: Diagnosis not present

## 2019-10-23 DIAGNOSIS — M75101 Unspecified rotator cuff tear or rupture of right shoulder, not specified as traumatic: Secondary | ICD-10-CM | POA: Diagnosis not present

## 2019-10-25 DIAGNOSIS — M75101 Unspecified rotator cuff tear or rupture of right shoulder, not specified as traumatic: Secondary | ICD-10-CM | POA: Diagnosis not present

## 2019-10-25 DIAGNOSIS — M25611 Stiffness of right shoulder, not elsewhere classified: Secondary | ICD-10-CM | POA: Diagnosis not present

## 2019-10-29 DIAGNOSIS — Z1231 Encounter for screening mammogram for malignant neoplasm of breast: Secondary | ICD-10-CM | POA: Diagnosis not present

## 2019-10-31 DIAGNOSIS — Z9889 Other specified postprocedural states: Secondary | ICD-10-CM | POA: Diagnosis not present

## 2019-11-01 DIAGNOSIS — M75101 Unspecified rotator cuff tear or rupture of right shoulder, not specified as traumatic: Secondary | ICD-10-CM | POA: Diagnosis not present

## 2019-11-01 DIAGNOSIS — M25611 Stiffness of right shoulder, not elsewhere classified: Secondary | ICD-10-CM | POA: Diagnosis not present

## 2019-11-07 DIAGNOSIS — M25611 Stiffness of right shoulder, not elsewhere classified: Secondary | ICD-10-CM | POA: Diagnosis not present

## 2019-11-07 DIAGNOSIS — M75101 Unspecified rotator cuff tear or rupture of right shoulder, not specified as traumatic: Secondary | ICD-10-CM | POA: Diagnosis not present

## 2019-12-12 DIAGNOSIS — M25611 Stiffness of right shoulder, not elsewhere classified: Secondary | ICD-10-CM | POA: Diagnosis not present

## 2019-12-12 DIAGNOSIS — Z9889 Other specified postprocedural states: Secondary | ICD-10-CM | POA: Diagnosis not present

## 2019-12-14 ENCOUNTER — Ambulatory Visit: Payer: BC Managed Care – PPO | Attending: Internal Medicine

## 2019-12-14 ENCOUNTER — Other Ambulatory Visit: Payer: BC Managed Care – PPO

## 2019-12-14 DIAGNOSIS — Z20822 Contact with and (suspected) exposure to covid-19: Secondary | ICD-10-CM

## 2019-12-15 LAB — NOVEL CORONAVIRUS, NAA: SARS-CoV-2, NAA: NOT DETECTED

## 2019-12-27 DIAGNOSIS — H43812 Vitreous degeneration, left eye: Secondary | ICD-10-CM | POA: Diagnosis not present

## 2020-01-10 DIAGNOSIS — H04123 Dry eye syndrome of bilateral lacrimal glands: Secondary | ICD-10-CM | POA: Diagnosis not present

## 2020-01-23 DIAGNOSIS — Z0000 Encounter for general adult medical examination without abnormal findings: Secondary | ICD-10-CM | POA: Diagnosis not present

## 2020-01-23 DIAGNOSIS — E7849 Other hyperlipidemia: Secondary | ICD-10-CM | POA: Diagnosis not present

## 2020-01-30 DIAGNOSIS — Z0000 Encounter for general adult medical examination without abnormal findings: Secondary | ICD-10-CM | POA: Diagnosis not present

## 2020-01-30 DIAGNOSIS — R3121 Asymptomatic microscopic hematuria: Secondary | ICD-10-CM | POA: Diagnosis not present

## 2020-01-30 DIAGNOSIS — D72819 Decreased white blood cell count, unspecified: Secondary | ICD-10-CM | POA: Diagnosis not present

## 2020-01-30 DIAGNOSIS — I7300 Raynaud's syndrome without gangrene: Secondary | ICD-10-CM | POA: Diagnosis not present

## 2020-01-30 DIAGNOSIS — Z1331 Encounter for screening for depression: Secondary | ICD-10-CM | POA: Diagnosis not present

## 2020-01-30 DIAGNOSIS — R82998 Other abnormal findings in urine: Secondary | ICD-10-CM | POA: Diagnosis not present

## 2020-01-30 DIAGNOSIS — R011 Cardiac murmur, unspecified: Secondary | ICD-10-CM | POA: Diagnosis not present

## 2020-02-08 DIAGNOSIS — Z1212 Encounter for screening for malignant neoplasm of rectum: Secondary | ICD-10-CM | POA: Diagnosis not present

## 2020-02-19 ENCOUNTER — Encounter: Payer: Self-pay | Admitting: Neurology

## 2020-02-19 ENCOUNTER — Other Ambulatory Visit: Payer: Self-pay

## 2020-02-19 ENCOUNTER — Ambulatory Visit: Payer: BLUE CROSS/BLUE SHIELD | Admitting: Neurology

## 2020-02-19 VITALS — BP 120/74 | HR 60 | Temp 98.3°F | Ht 67.0 in | Wt 132.0 lb

## 2020-02-19 DIAGNOSIS — G478 Other sleep disorders: Secondary | ICD-10-CM

## 2020-02-19 DIAGNOSIS — R519 Headache, unspecified: Secondary | ICD-10-CM

## 2020-02-19 DIAGNOSIS — R0689 Other abnormalities of breathing: Secondary | ICD-10-CM | POA: Diagnosis not present

## 2020-02-19 NOTE — Progress Notes (Signed)
SLEEP MEDICINE CLINIC    Provider:  Melvyn Novas, MD  Primary Care Physician:  Rodrigo Ran, MD [address] [place] [address]     Referring Provider: Rodrigo Ran, Md [address] [place],  [place] [address]          Chief Complaint according to patient   Patient presents with:    . New Patient (Initial Visit)     pt alone, rm 10. pt presents today she has been told she is always yawning. she states she never paid atn to this until it was brought up to her. she has never had a SS. pt states cant remember the last time that she woke up feeling well rested. she states she is a light sleeper and wakes up frequently during the night       HISTORY OF PRESENT ILLNESS:  Mallory Ramirez is a 44 year old [place] born  female patient and seen upon  a referral on 02/19/2020 from Dr Waynard Edwards.  Chief concern according to patient :  I yawn a lot and people have commented on this. I sleep only lightly - every sound wakes me. I have tried earplugs and I still wake up to 5 times, I drink water, and in the morning I wake with headaches.   I am very active, I eat healthy. I am trying to limit my amount of water - in PM.    I have the pleasure of seeing Tashawna Thom 02-19-2020, a right -handed Caucasian female with a possible sleep disorder. She  has a past medical history of HLD (hyperlipidemia) and Hypertension.   Family medical /sleep history: other family member on CPAP with OSA, insomnia, sleep walkers.    Social history:  Patient is working as a Futures trader and lives in a household with 4 persons/ alone. Family status is married , with  2 sons, 45 and 33 year old. The patient currently works/ used to work in shifts( Chief Technology Officer,). Pets are  present. Tobacco use; never .  ETOH use; socially,  Caffeine intake in form of Coffee( 1 espreso) Soda( none ) Tea ( none) or energy drinks. Regular exercise in form of gym attendance.    Hobbies : healthy life style.     Sleep habits are as  follows: The patient's dinner time is between 6.30 PM.  The patient goes to bed at 10 PM and continues to sleep for 3 hours, wakes for unknown reasons and takes a bathroom break, the first time at 1-2 AM.   The preferred sleep position is on her side, with the support of 1 pillows. She has some hot flashes during the night- adding to sleep interruptions. No diaphoretic.  Dreams are reportedly  frequent/vivid.  7 AM is the usual rise time. The patient wakes up with an alarm.  She reports not feeling refreshed or restored in AM, with symptoms such as morning headaches which resolve after drinking water.    Review of Systems: Out of a complete 14 system review, the patient complains of only the following symptoms, and all other reviewed systems are negative.:  Fatigue, sleepiness , snoring, fragmented sleep, Insomnia- fragmented sleep, perimenopausal .  Yawning.    How likely are you to doze in the following situations: 0 = not likely, 1 = slight chance, 2 = moderate chance, 3 = high chance   Sitting and Reading? Watching Television? Sitting inactive in a public place (theater or meeting)? As a passenger in a car for  an hour without a break? Lying down in the afternoon when circumstances permit? Sitting and talking to someone? Sitting quietly after lunch without alcohol? In a car, while stopped for a few minutes in traffic?   Total = 6/ 24 points   FSS endorsed at 21/ 63 points.   Social History   Socioeconomic History  . Marital status: Married    Spouse name: Not on file  . Number of children: Not on file  . Years of education: Not on file  . Highest education level: Not on file  Occupational History  . Not on file  Tobacco Use  . Smoking status: Never Smoker  . Smokeless tobacco: Never Used  Substance and Sexual Activity  . Alcohol use: Yes    Alcohol/week: 1.0 - 2.0 standard drinks    Types: 1 - 2 Glasses of wine per week  . Drug use: Not Currently  . Sexual activity:  Not on file  Other Topics Concern  . Not on file  Social History Narrative  . Not on file   Social Determinants of Health   Financial Resource Strain:   . Difficulty of Paying Living Expenses:   Food Insecurity:   . Worried About Programme researcher, broadcasting/film/video in the Last Year:   . Barista in the Last Year:   Transportation Needs:   . Freight forwarder (Medical):   Marland Kitchen Lack of Transportation (Non-Medical):   Physical Activity:   . Days of Exercise per Week:   . Minutes of Exercise per Session:   Stress:   . Feeling of Stress :   Social Connections:   . Frequency of Communication with Friends and Family:   . Frequency of Social Gatherings with Friends and Family:   . Attends Religious Services:   . Active Member of Clubs or Organizations:   . Attends Banker Meetings:   Marland Kitchen Marital Status:     Family History  Problem Relation Age of Onset  . Heart attack Father     Past Medical History:  Diagnosis Date  . HLD (hyperlipidemia)   . Hypertension     Past Surgical History:  Procedure Laterality Date  . CESAREAN SECTION    . ROTATOR CUFF REPAIR Right 2020     Current Outpatient Medications on File Prior to Visit  Medication Sig Dispense Refill  . rosuvastatin (CRESTOR) 20 MG tablet rosuvastatin 20 mg tablet  TK 1 T PO QD     No current facility-administered medications on file prior to visit.    Allergies  Allergen Reactions  . Ibuprofen     Pt doesn't like to take medication.    Physical exam:  Today's Vitals   02/19/20 1115  BP: 120/74  Pulse: 60  Temp: 98.3 F (36.8 C)  Weight: 132 lb (59.9 kg)  Height: 5\' 7"  (1.702 m)   Body mass index is 20.67 kg/m.   Wt Readings from Last 3 Encounters:  02/19/20 132 lb (59.9 kg)  10/17/19 135 lb (61.2 kg)  07/10/19 128 lb (58.1 kg)     Ht Readings from Last 3 Encounters:  02/19/20 5\' 7"  (1.702 m)  10/17/19 5\' 7"  (1.702 m)  07/10/19 5\' 7"  (1.702 m)      General: The patient is awake,  alert and appears not in acute distress. The patient is well groomed. Head: Normocephalic, atraumatic. Neck is supple. Mallampati 2,  neck circumference: 14 inches . Nasal airflow patent.   Retrognathia is not  seen.  Dental status: intact Cardiovascular:  Regular rate and cardiac rhythm by pulse,  without distended neck veins. Respiratory: Lungs are clear to auscultation.  Skin:  Without evidence of ankle edema, or rash. Trunk: The patient's posture is erect.   Neurologic exam : The patient is awake and alert, oriented to place and time.   Memory subjective described as intact.  Attention span & concentration ability appears normal.  Speech is fluent,  without  dysarthria, dysphonia or aphasia.  Mood and affect are appropriate.   Cranial nerves: no loss of smell or taste reported  Pupils are equal and briskly reactive to light. Funduscopic exam deferred.  Extraocular movements in vertical and horizontal planes were intact and without nystagmus. No Diplopia. Visual fields by finger perimetry are intact. Hearing was intact to soft voice and finger rubbing.  Facial sensation intact to fine touch. Facial motor strength is symmetric and tongue and uvula move midline.  Neck ROM : rotation, tilt and flexion extension were normal for age and shoulder shrug was symmetrical.    Motor exam:  Symmetric bulk, tone and ROM.   Normal tone without cog wheeling, symmetric grip strength . Sensory:  Fine touch, pinprick and vibration were tested  and  normal.  Proprioception tested in the upper extremities was normal.  Coordination: Rapid alternating movements in the fingers/hands were of normal speed.  The Finger-to-nose maneuver was intact without evidence of ataxia, dysmetria or tremor. Gait and station: Patient could rise unassisted from a seated position, walked without assistive device.  Stance is of normal width/ base and the patient turned with 3 steps.  Toe and heel walk were deferred.  Deep  tendon reflexes: in the  upper and lower extremities are symmetric and intact.  Babinski response was deferred.    Ms. Ivy Lynn is a 44 year old female patient of Dr. Elta Guadeloupe Parini's, born and raised in [place] his parents of [place] descent.  She has always looked healthy, she is by no means obese her body mass and this is 20.6 her blood pressure here was in normal range at 120/74.  She has not had inherited trait for hyperlipidemia and has started on a statin.  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  She is basically here because she yawns a lot and she has had morning headaches and feeling that her sleep is no longer as restful.  Why her sleep is interrupted can be partially explained by perimenopausal symptoms but also that she is kind of hypervigilant she wakes up from noises that do not need to wake her up and earplugs and a dark room have not necessarily eliminated all that.  She will go to the bathroom when she wakes up but she is not drinking by the urge to urinate.  She has a family history of coronary artery disease but no personal history.      After spending a total time of  40  minutes face to face and additional time for physical and neurologic examination, review of laboratory studies,  personal review of imaging studies, reports and results of other testing and review of referral information / records as far as provided in visit, I have established the following assessments:  1)  Sleep related headche 2)  excessive yawning  3)  Non-  restorative sleep.    My Plan is to proceed with:  1) HST, rule out hypoxia, snoring.  2) increase soy  Intake as phytoestrogens-  3) antihistamine - Allegra as needed.  4) consider tricyclic medication to  help sleep deeper.    I would like to thank Rodrigo Ran, MD and Rodrigo Ran, Md [address] [place],  [place] [address] for allowing me to meet with and to take care of this pleasant patient.   I plan to follow up either personally or through our NP within 2-3 month.    Electronically signed by: Melvyn Novas, MD 02/19/2020 11:30 AM  [facility] Board certified by The [employer] of Sleep Medicine and Diplomate of the [employer] of Sleep Medicine. Board certified In Neurology through the ABPN, Fellow of the [employer] of Neurology. Medical Director of [facility].

## 2020-02-19 NOTE — Patient Instructions (Signed)

## 2020-03-06 ENCOUNTER — Telehealth: Payer: Self-pay

## 2020-03-06 NOTE — Telephone Encounter (Signed)
Called patient twice and left voicemail's for getting scheduled for sleep study.

## 2020-03-11 NOTE — Telephone Encounter (Signed)
We have attempted to call the patient two times to schedule sleep study.  Patient has been unavailable at the phone numbers we have on file and has not returned our calls. If patient calls back we will schedule them for their sleep study.  

## 2020-03-19 ENCOUNTER — Ambulatory Visit: Payer: BC Managed Care – PPO | Attending: Internal Medicine

## 2020-03-19 DIAGNOSIS — Z20822 Contact with and (suspected) exposure to covid-19: Secondary | ICD-10-CM | POA: Diagnosis not present

## 2020-03-20 LAB — SARS-COV-2, NAA 2 DAY TAT

## 2020-03-20 LAB — NOVEL CORONAVIRUS, NAA: SARS-CoV-2, NAA: NOT DETECTED

## 2020-03-27 DIAGNOSIS — H04123 Dry eye syndrome of bilateral lacrimal glands: Secondary | ICD-10-CM | POA: Diagnosis not present

## 2020-06-26 DIAGNOSIS — N390 Urinary tract infection, site not specified: Secondary | ICD-10-CM | POA: Diagnosis not present

## 2020-06-26 DIAGNOSIS — R3121 Asymptomatic microscopic hematuria: Secondary | ICD-10-CM | POA: Diagnosis not present

## 2020-07-30 DIAGNOSIS — Z01419 Encounter for gynecological examination (general) (routine) without abnormal findings: Secondary | ICD-10-CM | POA: Diagnosis not present

## 2020-07-30 DIAGNOSIS — Z6821 Body mass index (BMI) 21.0-21.9, adult: Secondary | ICD-10-CM | POA: Diagnosis not present

## 2020-07-30 DIAGNOSIS — R5382 Chronic fatigue, unspecified: Secondary | ICD-10-CM | POA: Diagnosis not present

## 2020-07-30 DIAGNOSIS — R6882 Decreased libido: Secondary | ICD-10-CM | POA: Diagnosis not present

## 2020-07-30 DIAGNOSIS — N926 Irregular menstruation, unspecified: Secondary | ICD-10-CM | POA: Diagnosis not present

## 2020-08-28 DIAGNOSIS — R310 Gross hematuria: Secondary | ICD-10-CM | POA: Diagnosis not present

## 2020-09-02 DIAGNOSIS — F39 Unspecified mood [affective] disorder: Secondary | ICD-10-CM | POA: Diagnosis not present

## 2020-09-03 DIAGNOSIS — R310 Gross hematuria: Secondary | ICD-10-CM | POA: Diagnosis not present

## 2020-09-03 DIAGNOSIS — N3289 Other specified disorders of bladder: Secondary | ICD-10-CM | POA: Diagnosis not present

## 2020-09-03 DIAGNOSIS — N959 Unspecified menopausal and perimenopausal disorder: Secondary | ICD-10-CM | POA: Diagnosis not present

## 2020-09-03 DIAGNOSIS — R5383 Other fatigue: Secondary | ICD-10-CM | POA: Diagnosis not present

## 2020-09-04 DIAGNOSIS — Z79890 Hormone replacement therapy (postmenopausal): Secondary | ICD-10-CM | POA: Diagnosis not present

## 2020-09-04 DIAGNOSIS — Z79899 Other long term (current) drug therapy: Secondary | ICD-10-CM | POA: Diagnosis not present

## 2020-09-04 DIAGNOSIS — Z0000 Encounter for general adult medical examination without abnormal findings: Secondary | ICD-10-CM | POA: Diagnosis not present

## 2020-09-22 DIAGNOSIS — R310 Gross hematuria: Secondary | ICD-10-CM | POA: Diagnosis not present

## 2020-10-16 ENCOUNTER — Ambulatory Visit (INDEPENDENT_AMBULATORY_CARE_PROVIDER_SITE_OTHER): Payer: BC Managed Care – PPO | Admitting: Sports Medicine

## 2020-10-16 ENCOUNTER — Ambulatory Visit
Admission: RE | Admit: 2020-10-16 | Discharge: 2020-10-16 | Disposition: A | Discharge status: Home / Self Care | Payer: BC Managed Care – PPO | Source: Ambulatory Visit | Attending: Sports Medicine | Admitting: Sports Medicine

## 2020-10-16 ENCOUNTER — Other Ambulatory Visit: Payer: Self-pay

## 2020-10-16 VITALS — BP 110/68 | Ht 67.0 in | Wt 135.0 lb

## 2020-10-16 DIAGNOSIS — M79661 Pain in right lower leg: Secondary | ICD-10-CM | POA: Diagnosis not present

## 2020-10-16 NOTE — Progress Notes (Addendum)
   Subjective:    Patient ID: Mallory Ramirez, female    DOB: October 28, 1976, 44 y.o.   MRN: [id number]  HPI chief complaint: Right lower leg pain and left ankle pain  Very pleasant 44 year old female comes in today with a couple of different complaints.  She has chronic right lower leg pain.  She is an avid runner but has only recently been running about 10 miles a week.  No significant increase in volume recently.  She localizes her right lower leg pain to the tibia.  It is noticeable with running as well as with direct pressure.  Her pain has been present for several years.  No recent injury. She is also complaining of left ankle pain that she localizes to the tip of the distal left fibula.  She has a remote ankle injury, specifically an ankle sprain which has healed.  She again localizes all of her pain to the tip of the bone at the distal fibula.  She denies any swelling.    Review of Systems    Above Objective:   Physical Exam  Developed, fit appearing.  No acute distress.  Right lower leg: There is point tenderness to palpation directly over the distal third of the tibia.  No soft tissue swelling.  No tenderness to palpation along the medial tibial border. Left ankle: Full range of motion.  No effusion.  Good stability.  There is point tenderness to palpation over the tip of the distal fibula.  Neurovascularly intact distally. Walking without a limp.  Limited bedside ultrasound of the right tibia shows no evidence of tibial stress fracture.  Ultrasound of the left fibula shows no stress fracture here.  There is some increased Doppler activity at the distal fibula consistent with probable stress reaction.      Assessment & Plan:   Chronic right lower leg pain 2 weeks of left distal fibula pain likely secondary to early stress reaction  Given the chronicity of her right lower leg pain and tenderness to palpation directly over the tibia, I like to get an x-ray to rule out a distal  tibial stress fracture.  A normal x-ray would be reassuring of in her length of symptoms.  I recommended that we also make her new custom orthotics today.  She will need to modify her activity slightly until her left ankle is feeling better.  Do not see any need for immobilization at this point in time.  Follow-up for ongoing or recalcitrant issues.  Patient was fitted for a : standard, cushioned, semi-rigid orthotic. The orthotic was heated and afterward the patient stood on the orthotic blank positioned on the orthotic stand. The patient was positioned in subtalar neutral position and 10 degrees of ankle dorsiflexion in a weight bearing stance. After completion of molding, a stable base was applied to the orthotic blank. The blank was ground to a stable position for weight bearing. Size: 9 Base: Blue EVA Posting: none Additional orthotic padding: none  Addendum: X-rays of the right tib-fib are unremarkable.  No evidence of stress fracture.  Patient will be notified via telephone.

## 2020-10-17 ENCOUNTER — Encounter: Payer: Self-pay | Admitting: Sports Medicine

## 2020-10-17 ENCOUNTER — Other Ambulatory Visit: Payer: Self-pay

## 2020-10-17 ENCOUNTER — Encounter: Payer: Self-pay | Admitting: Vascular Surgery

## 2020-10-17 ENCOUNTER — Ambulatory Visit: Payer: BC Managed Care – PPO | Admitting: Vascular Surgery

## 2020-10-17 VITALS — BP 132/83 | HR 64 | Temp 98.4°F | Resp 20 | Ht 67.0 in | Wt 136.0 lb

## 2020-10-17 DIAGNOSIS — I871 Compression of vein: Secondary | ICD-10-CM | POA: Diagnosis not present

## 2020-10-17 NOTE — Progress Notes (Signed)
Patient ID: Mallory Ramirez, female   DOB: 12/11/1975, 44 y.o.   MRN: [id number]  Reason for Consult: New Patient (Initial Visit)   Referred by Rodrigo Ran, MD  Subjective:     HPI:  Mallory Ramirez is a 44 y.o. female had a history of left lower quadrant pain in 2019 underwent CT scan.  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  She now tells me that she does not have any residual pain.  She has had some pelvic issues particularly GYN issues and has been considered for hormonal therapy for this.  Currently she denies any pain she states that she has never been able to notice any blood in her urine there was always microscopic in nature.  She denies any history of kidney stones to cause hematuria.  She does not know of any dietary changes.  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  On today's visit she actually has no complaints.  Past Medical History:  Diagnosis Date  . HLD (hyperlipidemia)   . Hypertension    Family History  Problem Relation Age of Onset  . Heart attack Father    Past Surgical History:  Procedure Laterality Date  . CESAREAN SECTION    . ROTATOR CUFF REPAIR Right 2020    Short Social History:  Social History   Tobacco Use  . Smoking status: Never Smoker  . Smokeless tobacco: Never Used  Substance Use Topics  . Alcohol use: Yes    Alcohol/week: 1.0 - 2.0 standard drink    Types: 1 - 2 Glasses of wine per week    Allergies  Allergen Reactions  . Ibuprofen     Pt doesn't like to take medication.    Current Outpatient Medications  Medication Sig Dispense Refill  . Cholecalciferol 1.25 MG (50000 UT) capsule cholecalciferol (vitamin D3) 1,250 mcg (50,000 unit) capsule    . FLUoxetine (PROZAC) 20 MG tablet     . rosuvastatin (CRESTOR) 20 MG tablet rosuvastatin 20 mg tablet  TK 1  T PO QD     No current facility-administered medications for this visit.    Review of Systems  Constitutional:  Constitutional negative. HENT: HENT negative.  Eyes: Eyes negative.  Cardiovascular: Cardiovascular negative.  GI: Gastrointestinal negative.  GU: Positive for hematuria.       Previous pelvic pain Skin: Skin negative.  Neurological: Neurological negative. Hematologic: Hematologic/lymphatic negative.  Psychiatric: Psychiatric negative.        Objective:  Objective   Vitals:   10/17/20 0905  BP: 132/83  Pulse: 64  Resp: 20  Temp: 98.4 F (36.9 C)  SpO2: 98%  Weight: 136 lb (61.7 kg)  Height: 5\' 7"  (1.702 m)   Body mass index is 21.3 kg/m.  Physical Exam HENT:     Head: Normocephalic.     Nose:     Comments: Wearing a mask Eyes:     Extraocular Movements: Extraocular movements intact.     Pupils: Pupils are equal, round, and reactive to light.  Cardiovascular:     Rate and Rhythm: Normal rate.     Pulses: Normal pulses.  Pulmonary:     Effort: Pulmonary effort is normal.  Abdominal:     General: Abdomen is flat.     Palpations: Abdomen is soft. There is no mass.  Tenderness: There is no abdominal tenderness.  Musculoskeletal:        General: No swelling. Normal range of motion.     Cervical back: Neck supple.  Skin:    General: Skin is warm and dry.     Capillary Refill: Capillary refill takes less than 2 seconds.  Neurological:     General: No focal deficit present.     Mental Status: She is alert.  Psychiatric:        Mood and Affect: Mood normal.        Behavior: Behavior normal.        Thought Content: Thought content normal.        Judgment: Judgment normal.     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  Patient is asymptomatic today would not like any procedures unless absolutely necessary.  I discussed with her the options being continued watchful waiting at which time we will get a duplex in another year.  Certainly if she has worsening issues in that time we can see her sooner with a CT scan.     Maeola Harman MD [facility]

## 2020-10-30 DIAGNOSIS — R6882 Decreased libido: Secondary | ICD-10-CM | POA: Diagnosis not present

## 2020-10-30 DIAGNOSIS — N959 Unspecified menopausal and perimenopausal disorder: Secondary | ICD-10-CM | POA: Diagnosis not present

## 2020-11-01 DIAGNOSIS — Z20822 Contact with and (suspected) exposure to covid-19: Secondary | ICD-10-CM | POA: Diagnosis not present

## 2020-12-04 DIAGNOSIS — Z1231 Encounter for screening mammogram for malignant neoplasm of breast: Secondary | ICD-10-CM | POA: Diagnosis not present

## 2020-12-11 DIAGNOSIS — M79645 Pain in left finger(s): Secondary | ICD-10-CM | POA: Diagnosis not present

## 2020-12-11 DIAGNOSIS — M79644 Pain in right finger(s): Secondary | ICD-10-CM | POA: Diagnosis not present

## 2020-12-23 ENCOUNTER — Other Ambulatory Visit: Payer: Self-pay

## 2020-12-23 ENCOUNTER — Ambulatory Visit: Payer: BC Managed Care – PPO | Admitting: Sports Medicine

## 2020-12-23 DIAGNOSIS — M79672 Pain in left foot: Secondary | ICD-10-CM | POA: Diagnosis not present

## 2020-12-23 NOTE — Assessment & Plan Note (Signed)
Given history, presentation, physical exam, and ultrasound findings she most likely has a moderate contusion of the soft tissue and possible mild contusion of the 5th metatarsal. - Modification made to her orthotics today with cushion applied to the 5th met - She can continue running after resting for 3 more days and start back light and continue to work her way up to longer runs as long as the pain does not return. - If pain returns consider getting x-rays

## 2020-12-23 NOTE — Patient Instructions (Signed)
Declined

## 2020-12-23 NOTE — Progress Notes (Signed)
° ° °  SUBJECTIVE:   CHIEF COMPLAINT / HPI:   Lateral foot and ankle pain Mallory Ramirez is a very pleasant 44y/o female who is a very avid runner who presents today with 3 days of lateral foot and ankle pain. She is training for a marathon in October and ran 12 miles this past Saturday. During and immediately after the race she felt fine and had no pain. She had no falls or trauma during or after the run. She states she took a shower and after the run she began having pain behind her lateral ankle that radiated to her left foot on the outside. The pain behind the ankle is minimal but the pain on her foot is very tender and sore.  PERTINENT  PMH / PSH: Hx of left ankle instability, Hx of strain injury to right foot  OBJECTIVE:   BP 118/74    Ht $R'5\' 7"'uk$  (1.702 m)    Wt 135 lb (61.2 kg)    BMI 21.14 kg/m    [facility] 10/16/2020  Frequency of aerobic exercise (# of days/week) 4  Average time in minutes > 90  Frequency of strengthening activities (# of days/week) 3   Ankle/Foot, Left: No visible erythema, swelling, ecchymosis, or bony deformity. No notable pes planus deformity. Transverse arch grossly intact; No evidence of tibiotalar deviation; Range of motion is full in all directions. Strength is 5/5 in all directions. No tenderness at the insertion of the Achilles tendon; Mild peroneal tendon tenderness no  subluxation; No tenderness on posterior aspects of lateral and medial malleolus; Stable lateral and medial ligaments; Unremarkable squeeze and kleiger tests; Talar dome nontender; Positive for pain at base of 5th MT; No tenderness at the distal metatarsals; Able to walk 4 steps.  Special Tests:   - Anterior Drawer test: NEG   - Talar Tilt test: NEG   - Kleiger test: NEG   - Syndesmotic Squeeze test: NEG    Limited MSK U/S: Left Foot Lateral Malleolus well visualized and peroneus brevis and longus well visualized at the lateral malleolus with minimal swelling. No  fiber disruption of the peroneus brevis. No avulsion fracture or bone chipping seen at insertion of peroneus tendon. 5th metatarsal bone showed no step off sign and no hypoechoic changes surrounding the bone indicating stress injury. Impression: Normal ultrasound of the left foot  Ultrasound and interpretation by Dr. Garlan Fillers and Wolfgang Phoenix. Fields, MD    ASSESSMENT/PLAN:   Left foot pain Given history, presentation, physical exam, and ultrasound findings she most likely has a moderate contusion of the soft tissue and possible mild contusion of the 5th metatarsal. - Modification made to her orthotics today with cushion applied to the 5th met - She can continue running after resting for 3 more days and start back light and continue to work her way up to longer runs as long as the pain does not return. - If pain returns consider getting x-rays     Nuala Alpha, DO PGY-4, Sports Medicine Fellow [place]  I observed and examined the patient with the [facility] resident and agree with assessment and plan.  Note reviewed and modified by me. Ila Mcgill, MD

## 2021-01-07 ENCOUNTER — Ambulatory Visit
Admission: RE | Admit: 2021-01-07 | Discharge: 2021-01-07 | Disposition: A | Discharge status: Home / Self Care | Payer: BC Managed Care – PPO | Source: Ambulatory Visit | Attending: Sports Medicine | Admitting: Sports Medicine

## 2021-01-07 ENCOUNTER — Other Ambulatory Visit: Payer: Self-pay

## 2021-01-07 DIAGNOSIS — M79672 Pain in left foot: Secondary | ICD-10-CM

## 2021-01-07 NOTE — Progress Notes (Signed)
Pt is asking for a left foot xray order be placed. It was discussed at her last office visit.

## 2021-01-22 ENCOUNTER — Ambulatory Visit: Payer: BC Managed Care – PPO | Admitting: Sports Medicine

## 2021-01-28 DIAGNOSIS — M7918 Myalgia, other site: Secondary | ICD-10-CM | POA: Diagnosis not present

## 2021-01-28 DIAGNOSIS — M7672 Peroneal tendinitis, left leg: Secondary | ICD-10-CM | POA: Diagnosis not present

## 2021-01-28 DIAGNOSIS — M9906 Segmental and somatic dysfunction of lower extremity: Secondary | ICD-10-CM | POA: Diagnosis not present

## 2021-01-28 DIAGNOSIS — M79662 Pain in left lower leg: Secondary | ICD-10-CM | POA: Diagnosis not present

## 2021-02-02 DIAGNOSIS — M7918 Myalgia, other site: Secondary | ICD-10-CM | POA: Diagnosis not present

## 2021-02-02 DIAGNOSIS — M79662 Pain in left lower leg: Secondary | ICD-10-CM | POA: Diagnosis not present

## 2021-02-02 DIAGNOSIS — M7672 Peroneal tendinitis, left leg: Secondary | ICD-10-CM | POA: Diagnosis not present

## 2021-02-02 DIAGNOSIS — M9906 Segmental and somatic dysfunction of lower extremity: Secondary | ICD-10-CM | POA: Diagnosis not present

## 2021-02-05 DIAGNOSIS — M9906 Segmental and somatic dysfunction of lower extremity: Secondary | ICD-10-CM | POA: Diagnosis not present

## 2021-02-05 DIAGNOSIS — M7672 Peroneal tendinitis, left leg: Secondary | ICD-10-CM | POA: Diagnosis not present

## 2021-02-05 DIAGNOSIS — M79662 Pain in left lower leg: Secondary | ICD-10-CM | POA: Diagnosis not present

## 2021-02-05 DIAGNOSIS — M7918 Myalgia, other site: Secondary | ICD-10-CM | POA: Diagnosis not present

## 2021-02-11 DIAGNOSIS — M9903 Segmental and somatic dysfunction of lumbar region: Secondary | ICD-10-CM | POA: Diagnosis not present

## 2021-02-11 DIAGNOSIS — M7672 Peroneal tendinitis, left leg: Secondary | ICD-10-CM | POA: Diagnosis not present

## 2021-02-11 DIAGNOSIS — M9906 Segmental and somatic dysfunction of lower extremity: Secondary | ICD-10-CM | POA: Diagnosis not present

## 2021-02-11 DIAGNOSIS — M9904 Segmental and somatic dysfunction of sacral region: Secondary | ICD-10-CM | POA: Diagnosis not present

## 2021-02-20 DIAGNOSIS — M9904 Segmental and somatic dysfunction of sacral region: Secondary | ICD-10-CM | POA: Diagnosis not present

## 2021-02-20 DIAGNOSIS — M9903 Segmental and somatic dysfunction of lumbar region: Secondary | ICD-10-CM | POA: Diagnosis not present

## 2021-02-20 DIAGNOSIS — M9906 Segmental and somatic dysfunction of lower extremity: Secondary | ICD-10-CM | POA: Diagnosis not present

## 2021-02-20 DIAGNOSIS — M7672 Peroneal tendinitis, left leg: Secondary | ICD-10-CM | POA: Diagnosis not present

## 2021-02-25 DIAGNOSIS — M9903 Segmental and somatic dysfunction of lumbar region: Secondary | ICD-10-CM | POA: Diagnosis not present

## 2021-02-25 DIAGNOSIS — M9904 Segmental and somatic dysfunction of sacral region: Secondary | ICD-10-CM | POA: Diagnosis not present

## 2021-02-25 DIAGNOSIS — M7672 Peroneal tendinitis, left leg: Secondary | ICD-10-CM | POA: Diagnosis not present

## 2021-02-25 DIAGNOSIS — M9906 Segmental and somatic dysfunction of lower extremity: Secondary | ICD-10-CM | POA: Diagnosis not present

## 2021-03-04 DIAGNOSIS — M9903 Segmental and somatic dysfunction of lumbar region: Secondary | ICD-10-CM | POA: Diagnosis not present

## 2021-03-04 DIAGNOSIS — M9904 Segmental and somatic dysfunction of sacral region: Secondary | ICD-10-CM | POA: Diagnosis not present

## 2021-03-04 DIAGNOSIS — M7672 Peroneal tendinitis, left leg: Secondary | ICD-10-CM | POA: Diagnosis not present

## 2021-03-04 DIAGNOSIS — M9906 Segmental and somatic dysfunction of lower extremity: Secondary | ICD-10-CM | POA: Diagnosis not present

## 2021-03-16 DIAGNOSIS — M9903 Segmental and somatic dysfunction of lumbar region: Secondary | ICD-10-CM | POA: Diagnosis not present

## 2021-03-16 DIAGNOSIS — M9906 Segmental and somatic dysfunction of lower extremity: Secondary | ICD-10-CM | POA: Diagnosis not present

## 2021-03-16 DIAGNOSIS — M76822 Posterior tibial tendinitis, left leg: Secondary | ICD-10-CM | POA: Diagnosis not present

## 2021-03-16 DIAGNOSIS — M76821 Posterior tibial tendinitis, right leg: Secondary | ICD-10-CM | POA: Diagnosis not present

## 2021-03-23 DIAGNOSIS — M9903 Segmental and somatic dysfunction of lumbar region: Secondary | ICD-10-CM | POA: Diagnosis not present

## 2021-03-23 DIAGNOSIS — M76821 Posterior tibial tendinitis, right leg: Secondary | ICD-10-CM | POA: Diagnosis not present

## 2021-03-23 DIAGNOSIS — M76822 Posterior tibial tendinitis, left leg: Secondary | ICD-10-CM | POA: Diagnosis not present

## 2021-03-23 DIAGNOSIS — M9906 Segmental and somatic dysfunction of lower extremity: Secondary | ICD-10-CM | POA: Diagnosis not present

## 2021-03-26 DIAGNOSIS — M9903 Segmental and somatic dysfunction of lumbar region: Secondary | ICD-10-CM | POA: Diagnosis not present

## 2021-03-26 DIAGNOSIS — M9906 Segmental and somatic dysfunction of lower extremity: Secondary | ICD-10-CM | POA: Diagnosis not present

## 2021-03-26 DIAGNOSIS — M76821 Posterior tibial tendinitis, right leg: Secondary | ICD-10-CM | POA: Diagnosis not present

## 2021-03-26 DIAGNOSIS — M76822 Posterior tibial tendinitis, left leg: Secondary | ICD-10-CM | POA: Diagnosis not present

## 2021-03-30 DIAGNOSIS — M9903 Segmental and somatic dysfunction of lumbar region: Secondary | ICD-10-CM | POA: Diagnosis not present

## 2021-03-30 DIAGNOSIS — M76822 Posterior tibial tendinitis, left leg: Secondary | ICD-10-CM | POA: Diagnosis not present

## 2021-03-30 DIAGNOSIS — M9906 Segmental and somatic dysfunction of lower extremity: Secondary | ICD-10-CM | POA: Diagnosis not present

## 2021-03-30 DIAGNOSIS — M76821 Posterior tibial tendinitis, right leg: Secondary | ICD-10-CM | POA: Diagnosis not present

## 2021-04-14 DIAGNOSIS — M76821 Posterior tibial tendinitis, right leg: Secondary | ICD-10-CM | POA: Diagnosis not present

## 2021-04-14 DIAGNOSIS — M76822 Posterior tibial tendinitis, left leg: Secondary | ICD-10-CM | POA: Diagnosis not present

## 2021-04-14 DIAGNOSIS — M9903 Segmental and somatic dysfunction of lumbar region: Secondary | ICD-10-CM | POA: Diagnosis not present

## 2021-04-14 DIAGNOSIS — M9906 Segmental and somatic dysfunction of lower extremity: Secondary | ICD-10-CM | POA: Diagnosis not present

## 2021-04-20 DIAGNOSIS — M76822 Posterior tibial tendinitis, left leg: Secondary | ICD-10-CM | POA: Diagnosis not present

## 2021-04-20 DIAGNOSIS — M9906 Segmental and somatic dysfunction of lower extremity: Secondary | ICD-10-CM | POA: Diagnosis not present

## 2021-04-20 DIAGNOSIS — M9903 Segmental and somatic dysfunction of lumbar region: Secondary | ICD-10-CM | POA: Diagnosis not present

## 2021-04-20 DIAGNOSIS — M76821 Posterior tibial tendinitis, right leg: Secondary | ICD-10-CM | POA: Diagnosis not present

## 2021-04-29 DIAGNOSIS — M7732 Calcaneal spur, left foot: Secondary | ICD-10-CM | POA: Diagnosis not present

## 2021-04-29 DIAGNOSIS — M722 Plantar fascial fibromatosis: Secondary | ICD-10-CM | POA: Diagnosis not present

## 2021-04-29 DIAGNOSIS — M7731 Calcaneal spur, right foot: Secondary | ICD-10-CM | POA: Diagnosis not present

## 2021-04-29 DIAGNOSIS — M71571 Other bursitis, not elsewhere classified, right ankle and foot: Secondary | ICD-10-CM | POA: Diagnosis not present

## 2021-05-01 DIAGNOSIS — Z20822 Contact with and (suspected) exposure to covid-19: Secondary | ICD-10-CM | POA: Diagnosis not present

## 2021-06-30 DIAGNOSIS — E785 Hyperlipidemia, unspecified: Secondary | ICD-10-CM | POA: Diagnosis not present

## 2021-07-07 DIAGNOSIS — E785 Hyperlipidemia, unspecified: Secondary | ICD-10-CM | POA: Diagnosis not present

## 2021-07-07 DIAGNOSIS — R82998 Other abnormal findings in urine: Secondary | ICD-10-CM | POA: Diagnosis not present

## 2021-07-07 DIAGNOSIS — Z1339 Encounter for screening examination for other mental health and behavioral disorders: Secondary | ICD-10-CM | POA: Diagnosis not present

## 2021-07-07 DIAGNOSIS — Z0000 Encounter for general adult medical examination without abnormal findings: Secondary | ICD-10-CM | POA: Diagnosis not present

## 2021-07-07 DIAGNOSIS — Z1331 Encounter for screening for depression: Secondary | ICD-10-CM | POA: Diagnosis not present

## 2021-07-14 ENCOUNTER — Other Ambulatory Visit: Payer: Self-pay | Admitting: Internal Medicine

## 2021-07-14 DIAGNOSIS — E785 Hyperlipidemia, unspecified: Secondary | ICD-10-CM

## 2021-08-07 ENCOUNTER — Other Ambulatory Visit: Payer: BC Managed Care – PPO

## 2021-08-12 DIAGNOSIS — M76822 Posterior tibial tendinitis, left leg: Secondary | ICD-10-CM | POA: Diagnosis not present

## 2021-08-12 DIAGNOSIS — M76821 Posterior tibial tendinitis, right leg: Secondary | ICD-10-CM | POA: Diagnosis not present

## 2021-08-12 DIAGNOSIS — M7918 Myalgia, other site: Secondary | ICD-10-CM | POA: Diagnosis not present

## 2021-08-12 DIAGNOSIS — M9905 Segmental and somatic dysfunction of pelvic region: Secondary | ICD-10-CM | POA: Diagnosis not present

## 2021-08-12 DIAGNOSIS — M9903 Segmental and somatic dysfunction of lumbar region: Secondary | ICD-10-CM | POA: Diagnosis not present

## 2021-09-09 ENCOUNTER — Telehealth: Payer: Self-pay

## 2021-09-09 ENCOUNTER — Other Ambulatory Visit: Payer: Self-pay

## 2021-09-09 ENCOUNTER — Ambulatory Visit
Admission: RE | Admit: 2021-09-09 | Discharge: 2021-09-09 | Disposition: A | Discharge status: Home / Self Care | Payer: BC Managed Care – PPO | Source: Ambulatory Visit | Attending: Internal Medicine | Admitting: Internal Medicine

## 2021-09-09 DIAGNOSIS — E785 Hyperlipidemia, unspecified: Secondary | ICD-10-CM

## 2021-09-09 DIAGNOSIS — I871 Compression of vein: Secondary | ICD-10-CM

## 2021-09-09 NOTE — Telephone Encounter (Signed)
Patient calls today to report a 2 week history of left sided abdominal pain - no other s/s. 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 Per note, discussed with [facility] - scheduling patient for CT venogram with UA and BMP and follow up visit at [facility].

## 2021-09-10 ENCOUNTER — Other Ambulatory Visit: Payer: Self-pay

## 2021-09-10 ENCOUNTER — Other Ambulatory Visit: Payer: Self-pay | Admitting: Vascular Surgery

## 2021-09-10 DIAGNOSIS — I871 Compression of vein: Secondary | ICD-10-CM | POA: Diagnosis not present

## 2021-09-11 LAB — BASIC METABOLIC PANEL
BUN/Creatinine Ratio: 34 — ABNORMAL HIGH (ref 9–23)
BUN: 28 mg/dL — ABNORMAL HIGH (ref 6–24)
CO2: 25 mmol/L (ref 20–29)
Calcium: 9.9 mg/dL (ref 8.7–10.2)
Chloride: 95 mmol/L — ABNORMAL LOW (ref 96–106)
Creatinine, Ser: 0.82 mg/dL (ref 0.57–1.00)
Glucose: 91 mg/dL (ref 70–99)
Potassium: 4.8 mmol/L (ref 3.5–5.2)
Sodium: 134 mmol/L (ref 134–144)
eGFR: 89 mL/min/{1.73_m2} (ref >59)

## 2021-09-11 LAB — MICROSCOPIC EXAMINATION
Bacteria, UA: NONE SEEN
Casts: NONE SEEN /lpf
RBC, Urine: NONE SEEN /hpf (ref 0–2)

## 2021-09-11 LAB — URINALYSIS, ROUTINE W REFLEX MICROSCOPIC
Bilirubin, UA: NEGATIVE
Glucose, UA: NEGATIVE
Ketones, UA: NEGATIVE
Nitrite, UA: NEGATIVE
Protein,UA: NEGATIVE
RBC, UA: NEGATIVE
Specific Gravity, UA: 1.007 (ref 1.005–1.030)
Urobilinogen, Ur: 0.2 mg/dL (ref 0.2–1.0)
pH, UA: 6.5 (ref 5.0–7.5)

## 2021-09-17 ENCOUNTER — Other Ambulatory Visit: Payer: Self-pay

## 2021-09-17 ENCOUNTER — Ambulatory Visit (HOSPITAL_COMMUNITY)
Admission: RE | Admit: 2021-09-17 | Discharge: 2021-09-17 | Disposition: A | Discharge status: Home / Self Care | Payer: BC Managed Care – PPO | Source: Ambulatory Visit | Attending: Vascular Surgery | Admitting: Vascular Surgery

## 2021-09-17 DIAGNOSIS — I871 Compression of vein: Secondary | ICD-10-CM | POA: Insufficient documentation

## 2021-09-17 MED ORDER — IOHEXOL 350 MG/ML SOLN
100.0000 mL | Freq: Once | INTRAVENOUS | Status: AC | PRN
  Administered 2021-09-17: 100 mL via INTRAVENOUS

## 2021-09-21 DIAGNOSIS — D225 Melanocytic nevi of trunk: Secondary | ICD-10-CM | POA: Diagnosis not present

## 2021-09-21 DIAGNOSIS — D2361 Other benign neoplasm of skin of right upper limb, including shoulder: Secondary | ICD-10-CM | POA: Diagnosis not present

## 2021-09-21 DIAGNOSIS — L814 Other melanin hyperpigmentation: Secondary | ICD-10-CM | POA: Diagnosis not present

## 2021-09-21 DIAGNOSIS — L918 Other hypertrophic disorders of the skin: Secondary | ICD-10-CM | POA: Diagnosis not present

## 2021-09-23 ENCOUNTER — Other Ambulatory Visit: Payer: Self-pay

## 2021-09-23 ENCOUNTER — Encounter: Payer: Self-pay | Admitting: Vascular Surgery

## 2021-09-23 ENCOUNTER — Ambulatory Visit: Payer: BC Managed Care – PPO | Admitting: Vascular Surgery

## 2021-09-23 VITALS — BP 108/73 | HR 67 | Temp 98.5°F | Resp 20 | Ht 67.0 in | Wt 141.0 lb

## 2021-09-23 DIAGNOSIS — I871 Compression of vein: Secondary | ICD-10-CM | POA: Diagnosis not present

## 2021-09-23 NOTE — Progress Notes (Signed)
Patient ID: Mallory Ramirez, female   DOB: [birth year], 45 y.o.   MRN: [id number]  Reason for Consult: Follow-up   Referred by Rodrigo Ran, MD  Subjective:     HPI:  Mallory Ramirez is a 45 y.o. 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  At that time she did have microscopic hematuria and pelvic pain but mostly had resolved.  She recently called our office with recurrent pain.  She did recently run a marathon and stated that about halfway through she did have severe pain.  On Saturday she ran 10 miles and stated the pain was at least moderate after mile 5.  She obviously is able to stay active.  She states that the pain starts about her left hip and radiates down to her medial thigh and groin area.  This does not limit her activity and sometimes is only mild in nature but at times can be severe and somewhat debilitating.  She had a CT scan prior to our visit and also had urinalysis.  She has not noted any blood in her urine.  She is not had any renal problems in the past.  She does not take any antiplatelet or blood thinners.  She was diagnosed with a protein S deficiency remotely in [place] but this was not confirmed on testing here in the [place] and she does not know of any coagulability disorders.  Past Medical History:  Diagnosis Date   HLD (hyperlipidemia)    Hypertension    Family History  Problem Relation Age of Onset   Heart attack Father    Past Surgical History:  Procedure Laterality Date   CESAREAN SECTION     ROTATOR CUFF REPAIR Right 2020    Short Social History:  Social History   Tobacco Use   Smoking status: Never   Smokeless tobacco: Never  Substance Use Topics   Alcohol use: Yes    Alcohol/week: 1.0 - 2.0 standard drink    Types: 1 - 2 Glasses of wine per week    No Known Allergies  Current Outpatient Medications  Medication Sig Dispense Refill   rosuvastatin (CRESTOR) 20 MG tablet rosuvastatin 20 mg tablet  TK 1 T  PO QD     No current facility-administered medications for this visit.    Review of Systems  Constitutional:  Constitutional negative. HENT: HENT negative.  Eyes: Eyes negative.  Respiratory: Respiratory negative.  Cardiovascular: Cardiovascular negative.  GI: Gastrointestinal negative.  Musculoskeletal:       Left hip radiating into the left groin pain Skin: Skin negative.  Neurological: Neurological negative. Hematologic: Hematologic/lymphatic negative.  Psychiatric: Psychiatric negative.       Objective:  Objective   Vitals:   09/23/21 1518  BP: 108/73  Pulse: 67  Resp: 20  Temp: 98.5 F (36.9 C)  SpO2: 96%  Weight: 141 lb (64 kg)  Height: 5\' 7"  (1.702 m)   Body mass index is 22.08 kg/m.  Physical Exam HENT:     Head: Normocephalic.     Nose:     Comments: Wearing a mask Eyes:     Pupils: Pupils are equal, round, and reactive to light.  Cardiovascular:     Pulses: Normal pulses.  Pulmonary:     Effort: Pulmonary effort is normal.  Abdominal:     General: Abdomen is flat.     Palpations: Abdomen is soft.  Musculoskeletal:        General: Normal range of motion.  Right lower leg: No edema.     Left lower leg: No edema.  Skin:    General: Skin is warm and dry.     Capillary Refill: Capillary refill takes less than 2 seconds.  Neurological:     General: No focal deficit present.     Mental Status: She is alert.  Psychiatric:        Mood and Affect: Mood normal.        Behavior: Behavior normal.        Thought Content: Thought content normal.    Data: CTA IMPRESSION: CT angiogram confirms evidence of "nutcracker syndrome", with progression of preaortic left renal vein attenuation/compression posterior to the SMA and engorged collateral venous drainage of left paravertebral veins and left gonadal veins. The bilateral adnexal veins are also engorged, indicating a left to right pelvic venous drainage pathway.   No CT evidence of significant  May-Thurner configuration       Assessment/Plan:     45 year old female with CT evidence of nutcracker syndrome and left lower extremity pelvic pain that is likely related to engorged veins particularly with her activity levels.  I discussed with her her options of continued nonoperative therapy versus pursuing venography with intravascular ultrasound although I would not consider stenting she could be a candidate for renal vein transposition.  I discussed with her that I would only recommend this if she were having issues with hematuria or protein in her urine or elevated creatinine or if her pain was unbearable which at this time she states that she can certainly tolerate and live with.  Unfortunately there is not an easy solution to this problem as I see it and she understands this as well.  We discussed baby aspirin therapy daily which does help some patients but if she has no resolution of symptoms in 2 to 4 weeks she can certainly stop this as well.  We will have her follow-up in 1 year with a left renal vein duplex.  She states that her primary care doctor does check her urine yearly and we we will follow-up on this result at our next visit as well.     Waynetta Sandy MD [facility]

## 2021-09-25 ENCOUNTER — Other Ambulatory Visit: Payer: Self-pay

## 2021-09-25 DIAGNOSIS — R638 Other symptoms and signs concerning food and fluid intake: Secondary | ICD-10-CM | POA: Insufficient documentation

## 2021-09-25 DIAGNOSIS — I824Y9 Acute embolism and thrombosis of unspecified deep veins of unspecified proximal lower extremity: Secondary | ICD-10-CM

## 2021-09-25 DIAGNOSIS — R590 Localized enlarged lymph nodes: Secondary | ICD-10-CM | POA: Insufficient documentation

## 2021-09-25 DIAGNOSIS — N898 Other specified noninflammatory disorders of vagina: Secondary | ICD-10-CM | POA: Insufficient documentation

## 2021-09-25 DIAGNOSIS — F39 Unspecified mood [affective] disorder: Secondary | ICD-10-CM | POA: Insufficient documentation

## 2021-10-21 ENCOUNTER — Encounter (HOSPITAL_COMMUNITY): Payer: BC Managed Care – PPO

## 2021-10-21 ENCOUNTER — Ambulatory Visit: Payer: No Typology Code available for payment source | Admitting: Vascular Surgery

## 2021-12-07 DIAGNOSIS — Z01419 Encounter for gynecological examination (general) (routine) without abnormal findings: Secondary | ICD-10-CM | POA: Diagnosis not present

## 2021-12-07 DIAGNOSIS — Z1231 Encounter for screening mammogram for malignant neoplasm of breast: Secondary | ICD-10-CM | POA: Diagnosis not present

## 2021-12-07 DIAGNOSIS — Z6821 Body mass index (BMI) 21.0-21.9, adult: Secondary | ICD-10-CM | POA: Diagnosis not present

## 2021-12-07 DIAGNOSIS — Z124 Encounter for screening for malignant neoplasm of cervix: Secondary | ICD-10-CM | POA: Diagnosis not present

## 2021-12-07 DIAGNOSIS — Z113 Encounter for screening for infections with a predominantly sexual mode of transmission: Secondary | ICD-10-CM | POA: Diagnosis not present

## 2021-12-10 ENCOUNTER — Encounter: Payer: Self-pay | Admitting: Gastroenterology

## 2021-12-16 DIAGNOSIS — H43392 Other vitreous opacities, left eye: Secondary | ICD-10-CM | POA: Diagnosis not present

## 2021-12-23 ENCOUNTER — Other Ambulatory Visit: Payer: Self-pay

## 2021-12-23 ENCOUNTER — Ambulatory Visit (AMBULATORY_SURGERY_CENTER): Payer: BC Managed Care – PPO

## 2021-12-23 VITALS — Ht 66.0 in | Wt 137.0 lb

## 2021-12-23 DIAGNOSIS — Z1211 Encounter for screening for malignant neoplasm of colon: Secondary | ICD-10-CM

## 2021-12-23 MED ORDER — NA SULFATE-K SULFATE-MG SULF 17.5-3.13-1.6 GM/177ML PO SOLN: 1.0000 | Freq: Once | ORAL | 0 refills | Status: AC

## 2021-12-23 NOTE — Progress Notes (Signed)
No egg or soy allergy known to patient  °No issues known to pt with past sedation with any surgeries or procedures °Patient denies ever being told they had issues or difficulty with intubation  °No FH of Malignant Hyperthermia °Pt is not on diet pills °Pt is not on home 02  °Pt is not on blood thinners  °Pt denies issues with constipation  °No A fib or A flutter °Pt is fully vaccinated for Covid x 2; °NO PA's for preps discussed with pt in PV today  °Discussed with pt there will be an out-of-pocket cost for prep and that varies from $0 to 70 + dollars - pt verbalized understanding  °Due to the COVID-19 pandemic we are asking patients to follow certain guidelines in PV and the [facility]   °Pt aware of COVID protocols and [facility] guidelines  °PV completed over the phone. Pt verified name, DOB, address and insurance during PV today.  °Pt mailed instruction packet with copy of consent form to read and not return, and instructions.  °Pt encouraged to call with questions or issues.  °If pt has My chart, procedure instructions sent via My Chart  ° °

## 2022-01-06 ENCOUNTER — Encounter: Payer: BC Managed Care – PPO | Admitting: Gastroenterology

## 2022-01-13 ENCOUNTER — Encounter: Payer: Self-pay | Admitting: Gastroenterology

## 2022-01-19 ENCOUNTER — Encounter: Payer: Self-pay | Admitting: Gastroenterology

## 2022-01-19 ENCOUNTER — Ambulatory Visit (AMBULATORY_SURGERY_CENTER): Payer: BC Managed Care – PPO | Admitting: Gastroenterology

## 2022-01-19 VITALS — BP 133/69 | HR 51 | Temp 98.9°F | Resp 16 | Ht 66.0 in | Wt 137.0 lb

## 2022-01-19 DIAGNOSIS — Z1211 Encounter for screening for malignant neoplasm of colon: Secondary | ICD-10-CM

## 2022-01-19 MED ORDER — SODIUM CHLORIDE 0.9 % IV SOLN: 500.0000 mL | Freq: Once | INTRAVENOUS | Status: DC

## 2022-01-19 NOTE — Progress Notes (Signed)
? ?GASTROENTEROLOGY PROCEDURE H&P NOTE  ? ?Primary Care Physician: ?Crist Infante, MD ? ? ? ?Reason for Procedure:   Colon cancer screening ? ?Plan:    Colonoscopy ? ?Patient is appropriate for endoscopic procedure(s) in the ambulatory ([place]) setting. ? ?The nature of the procedure, as well as the risks, benefits, and alternatives were carefully and thoroughly reviewed with the patient. Ample time for discussion and questions allowed. The patient understood, was satisfied, and agreed to proceed.  ? ? ? ?HPI: ?Mallory Ramirez is a 45 y.o. female who presents for Colonoscopy for routine CRC screening. No GI sxs. No FHX of CRC.  ? ? ?Past Medical History:  ?Diagnosis Date  ? HLD (hyperlipidemia)   ? Hypertension   ? Seasonal allergies   ? ? ?Past Surgical History:  ?Procedure Laterality Date  ? CESAREAN SECTION    ? ROTATOR CUFF REPAIR Right 2020  ? ? ?Prior to Admission medications   ?Medication Sig Start Date End Date Taking? Authorizing Provider  ?doxycycline (ADOXA) 50 MG tablet Take 50 mg by mouth 2 (two) times daily. 01/15/22  Yes [provider]  ?FLUoxetine (PROZAC) 10 MG capsule Take 10 mg by mouth daily. 11/24/21  Yes [provider]  ?rosuvastatin (CRESTOR) 20 MG tablet Take 20 mg by mouth daily.   Yes [provider]  ?Cholecalciferol (VITAMIN D3 PO) Take 1 tablet by mouth daily at 6 (six) AM.    [provider]  ?Coenzyme Q10 (COQ10 PO) Take 1 tablet by mouth daily at 6 (six) AM.    [provider]  ?Omega-3 Fatty Acids (OMEGA 3 PO) Take 1 tablet by mouth daily at 6 (six) AM.    [provider]  ? ? ?Current Outpatient Medications  ?Medication Sig Dispense Refill  ? doxycycline (ADOXA) 50 MG tablet Take 50 mg by mouth 2 (two) times daily.    ? FLUoxetine (PROZAC) 10 MG capsule Take 10 mg by mouth daily.    ? rosuvastatin (CRESTOR) 20 MG tablet Take 20 mg by mouth daily.    ? Cholecalciferol (VITAMIN D3 PO) Take 1 tablet by mouth daily at 6 (six) AM.     ? Coenzyme Q10 (COQ10 PO) Take 1 tablet by mouth daily at 6 (six) AM.    ? Omega-3 Fatty Acids (OMEGA 3 PO) Take 1 tablet by mouth daily at 6 (six) AM.    ? ?Current Facility-Administered Medications  ?Medication Dose Route Frequency Provider Last Rate Last Admin  ? 0.9 %  sodium chloride infusion  500 mL Intravenous Once Jadyn Barge V, DO      ? ? ?Allergies as of 01/19/2022  ? (No Known Allergies)  ? ? ?Family History  ?Problem Relation Age of Onset  ? Heart attack Father   ? Colon polyps Neg Hx   ? Colon cancer Neg Hx   ? Esophageal cancer Neg Hx   ? Stomach cancer Neg Hx   ? Rectal cancer Neg Hx   ? ? ?Social History  ? ?Socioeconomic History  ? Marital status: Married  ?  Spouse name: Not on file  ? Number of children: Not on file  ? Years of education: Not on file  ? Highest education level: Not on file  ?Occupational History  ? Not on file  ?Tobacco Use  ? Smoking status: Never  ? Smokeless tobacco: Never  ?Vaping Use  ? Vaping Use: Never used  ?Substance and Sexual Activity  ? Alcohol use: Yes  ?  Alcohol/week:  2.0 - 3.0 standard drinks  ?  Types: 2 - 3 Standard drinks or equivalent per week  ? Drug use: Not Currently  ? Sexual activity: Not on file  ?Other Topics Concern  ? Not on file  ?Social History Narrative  ? Not on file  ? ?Social Determinants of Health  ? ?Financial Resource Strain: Not on file  ?Food Insecurity: Not on file  ?Transportation Needs: Not on file  ?Physical Activity: Not on file  ?Stress: Not on file  ?Social Connections: Not on file  ?Intimate Partner Violence: Not on file  ? ? ?Physical Exam: ?Vital signs in last 24 hours: ?@BP  133/76   Pulse 63   Temp 98.9 ?F (37.2 ?C)   Resp (!) 9   Ht 5\' 6"  (1.676 m)   Wt 137 lb (62.1 kg)   SpO2 100%   BMI 22.11 kg/m?  ?GEN: NAD ?EYE: Sclerae anicteric ?ENT: MMM ?CV: Non-tachycardic ?Pulm: CTA b/l ?GI: Soft, NT/ND ?NEURO:  Alert & Oriented x 3 ? ? ?Gerrit Heck, DO ?[facility] ? ? ?01/19/2022 3:25 PM ? ?

## 2022-01-19 NOTE — Progress Notes (Signed)
Report to PACU, RN, vss, BBS= Clear.  

## 2022-01-19 NOTE — Patient Instructions (Signed)
YOU HAD AN ENDOSCOPIC PROCEDURE TODAY AT THE  ENDOSCOPY CENTER:   Refer to the procedure report that was given to you for any specific questions about what was found during the examination.  If the procedure report does not answer your questions, please call your gastroenterologist to clarify.  If you requested that your care partner not be given the details of your procedure findings, then the procedure report has been included in a sealed envelope for you to review at your convenience later. ° °YOU SHOULD EXPECT: Some feelings of bloating in the abdomen. Passage of more gas than usual.  Walking can help get rid of the air that was put into your GI tract during the procedure and reduce the bloating. If you had a lower endoscopy (such as a colonoscopy or flexible sigmoidoscopy) you may notice spotting of blood in your stool or on the toilet paper. If you underwent a bowel prep for your procedure, you may not have a normal bowel movement for a few days. ° °Please Note:  You might notice some irritation and congestion in your nose or some drainage.  This is from the oxygen used during your procedure.  There is no need for concern and it should clear up in a day or so. ° °SYMPTOMS TO REPORT IMMEDIATELY: ° °Following lower endoscopy (colonoscopy or flexible sigmoidoscopy): ° Excessive amounts of blood in the stool ° Significant tenderness or worsening of abdominal pains ° Swelling of the abdomen that is new, acute ° Fever of 100°F or higher ° °For urgent or emergent issues, a gastroenterologist can be reached at any hour by calling [phone]. °Do not use MyChart messaging for urgent concerns.  ° ° °DIET:  We do recommend a small meal at first, but then you may proceed to your regular diet.  Drink plenty of fluids but you should avoid alcoholic beverages for 24 hours. ° °ACTIVITY:  You should plan to take it easy for the rest of today and you should NOT DRIVE or use heavy machinery until tomorrow (because of  the sedation medicines used during the test).   ° °FOLLOW UP: °Our staff will call the number listed on your records 48-72 hours following your procedure to check on you and address any questions or concerns that you may have regarding the information given to you following your procedure. If we do not reach you, we will leave a message.  We will attempt to reach you two times.  During this call, we will ask if you have developed any symptoms of COVID 19. If you develop any symptoms (ie: fever, flu-like symptoms, shortness of breath, cough etc.) before then, please call [phone].  If you test positive for Covid 19 in the 2 weeks post procedure, please call and report this information to us.   ° °SIGNATURES/CONFIDENTIALITY: °You and/or your care partner have signed paperwork which will be entered into your electronic medical record.  These signatures attest to the fact that that the information above on your After Visit Summary has been reviewed and is understood.  Full responsibility of the confidentiality of this discharge information lies with you and/or your care-partner.  °

## 2022-01-19 NOTE — Op Note (Signed)
[place] ?Patient Name: Mallory Ramirez [facility] ?Procedure Date: 01/19/2022 3:20 PM ?MRN: [id number] ?Endoscopist: Gerrit Heck , MD ?Age: 46 ?Referring MD:  ?Date of Birth: December 18, 1975 ?Gender: Female ?Account #: [id number] ?Procedure:                Colonoscopy ?Indications:              Screening for colorectal malignant neoplasm, This  ?                          is the patient's first colonoscopy ?Medicines:                Monitored Anesthesia Care ?Procedure:                Pre-Anesthesia Assessment: ?                          - Prior to the procedure, a History and Physical  ?                          was performed, and patient medications and  ?                          allergies were reviewed. The patient's tolerance of  ?                          previous anesthesia was also reviewed. The risks  ?                          and benefits of the procedure and the sedation  ?                          options and risks were discussed with the patient.  ?                          All questions were answered, and informed consent  ?                          was obtained. Prior Anticoagulants: The patient has  ?                          taken no previous anticoagulant or antiplatelet  ?                          agents. ASA Grade Assessment: II - A patient with  ?                          mild systemic disease. After reviewing the risks  ?                          and benefits, the patient was deemed in  ?                          satisfactory condition to undergo the procedure. ?  After obtaining informed consent, the colonoscope  ?                          was passed under direct vision. Throughout the  ?                          procedure, the patient's blood pressure, pulse, and  ?                          oxygen saturations were monitored continuously. The  ?                          Olympus [id number]) Colonoscope was  ?                          introduced through  the anus and advanced to the the  ?                          cecum, identified by appendiceal orifice and  ?                          ileocecal valve. The colonoscopy was performed  ?                          without difficulty. The patient tolerated the  ?                          procedure well. The quality of the bowel  ?                          preparation was good. The ileocecal valve,  ?                          appendiceal orifice, and rectum were photographed. ?Scope In: 3:36:00 PM ?Scope Out: 3:52:11 PM ?Scope Withdrawal Time: 0 hours 10 minutes 29 seconds  ?Total Procedure Duration: 0 hours 16 minutes 11 seconds  ?Findings:                 The perianal and digital rectal examinations were  ?                          normal. ?                          The entire colon appeared normal. ?                          The retroflexed view of the distal rectum and anal  ?                          verge was normal and showed no anal or rectal  ?                          abnormalities. ?Complications:            No immediate complications. ?  Estimated Blood Loss:     Estimated blood loss: none. ?Impression:               - The entire examined colon is normal. ?                          - The distal rectum and anal verge are normal on  ?                          retroflexion view. ?                          - No specimens collected. ?Recommendation:           - Patient has a contact number available for  ?                          emergencies. The signs and symptoms of potential  ?                          delayed complications were discussed with the  ?                          patient. Return to normal activities tomorrow.  ?                          Written discharge instructions were provided to the  ?                          patient. ?                          - Resume previous diet. ?                          - Continue present medications. ?                          - Repeat colonoscopy in 10 years for  screening  ?                          purposes. ?                          - Return to GI office PRN. ?Gerrit Heck, MD ?01/19/2022 3:56:54 PM ?

## 2022-01-21 ENCOUNTER — Telehealth: Payer: Self-pay | Admitting: *Deleted

## 2022-01-21 NOTE — Telephone Encounter (Signed)
?  Follow up Call- ? ?Call back number 01/19/2022  ?Post procedure Call Back phone  # [phone]  ?Permission to leave phone message Yes  ?Some recent data might be hidden  ?  ? ?Patient questions: ? ?Do you have a fever, pain , or abdominal swelling? No. ?Pain Score  0 * ? ?Have you tolerated food without any problems? Yes.   ? ?Have you been able to return to your normal activities? Yes.   ? ?Do you have any questions about your discharge instructions: ?Diet   No. ?Medications  No. ?Follow up visit  No. ? ?Do you have questions or concerns about your Care? No. ? ?Actions: ?* If pain score is 4 or above: ?No action needed, pain <4. ? ? ? ? ?

## 2022-01-27 DIAGNOSIS — I7300 Raynaud's syndrome without gangrene: Secondary | ICD-10-CM | POA: Diagnosis not present

## 2022-01-27 DIAGNOSIS — N943 Premenstrual tension syndrome: Secondary | ICD-10-CM | POA: Diagnosis not present

## 2022-01-27 DIAGNOSIS — Z79899 Other long term (current) drug therapy: Secondary | ICD-10-CM | POA: Diagnosis not present

## 2022-01-27 DIAGNOSIS — R5383 Other fatigue: Secondary | ICD-10-CM | POA: Diagnosis not present

## 2022-01-27 DIAGNOSIS — D72819 Decreased white blood cell count, unspecified: Secondary | ICD-10-CM | POA: Diagnosis not present

## 2022-01-27 DIAGNOSIS — R7989 Other specified abnormal findings of blood chemistry: Secondary | ICD-10-CM | POA: Diagnosis not present

## 2022-02-27 IMAGING — CT CT CARDIAC CORONARY ARTERY CALCIUM SCORE
3 series · 14 of 20 positions shown, 16 images · non-contrast
Comparison: None.

CLINICAL DATA: 45-year-old Caucasian female with history of
hyperlipidemia and family history of heart disease.

EXAM:
CT CARDIAC CORONARY ARTERY CALCIUM SCORE
TECHNIQUE: Non-contrast imaging through the heart was performed using
prospective ECG gating. Image post processing was performed on an
independent workstation, allowing for quantitative analysis of the
heart and coronary arteries. Note that this exam targets the heart
and the chest was not imaged in its entirety.

[Series 2: calcium scoring 2.00 qr36 bestdiast 69% hrt calciu · axial · 0.33mm/px · z∈[+1493,+1589]mm · 4 of 80 slices shown]
[im 16/80  vessel]
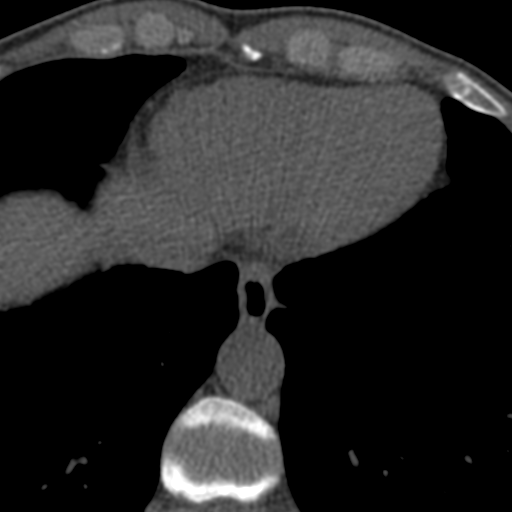
[im 32/80  vessel]
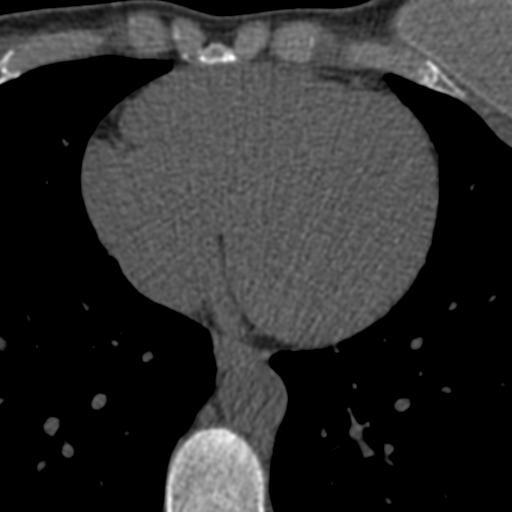
[im 48/80  vessel]
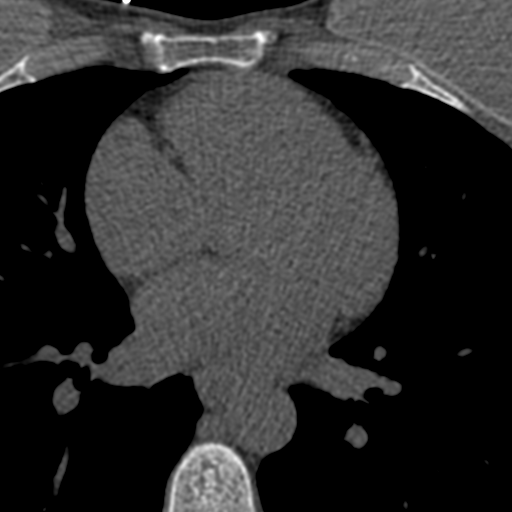
[im 64/80  vessel]
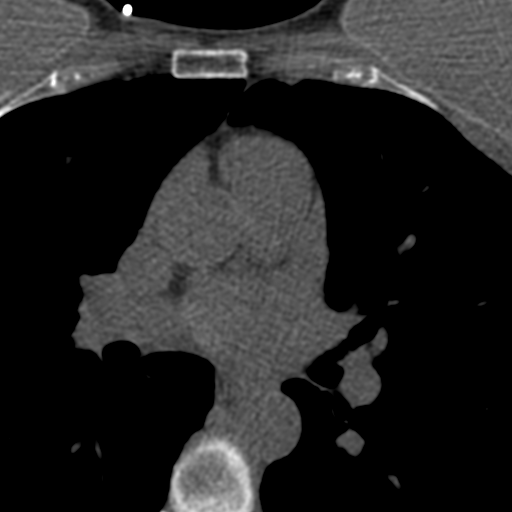

[Series 3: calcium scoring 2.00 br40 bestdiast 69% axial · axial · 0.49mm/px · z∈[+1489,+1593]mm · 5 of 80 slices shown, 7 images]
[im 14/80  vessel]
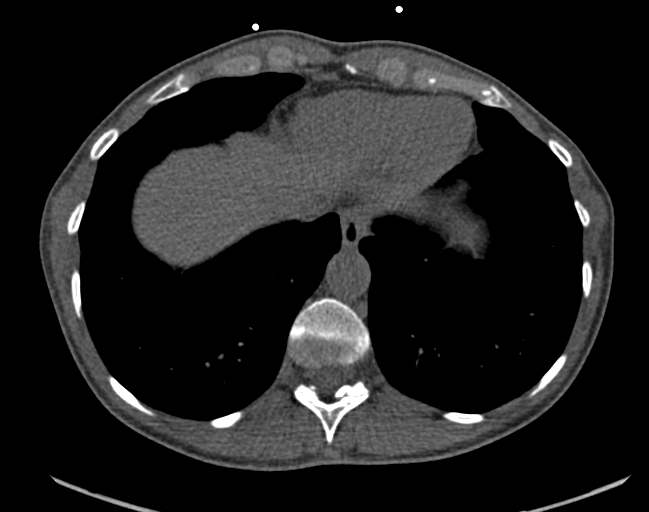
[im 14/80  lung]
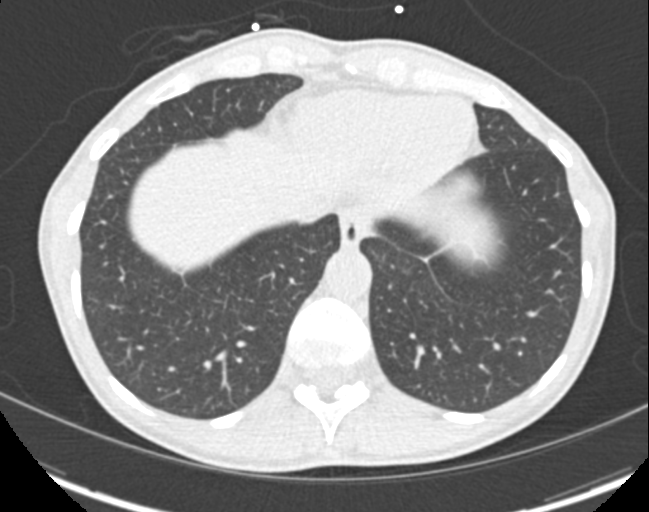
[im 27/80  vessel]
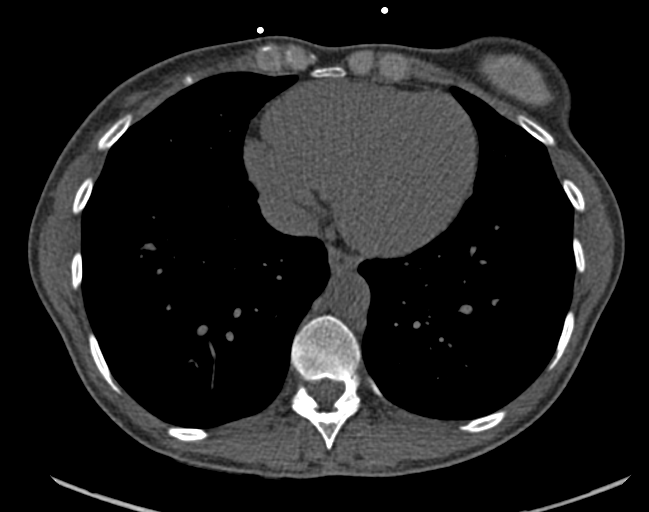
[im 40/80  vessel]
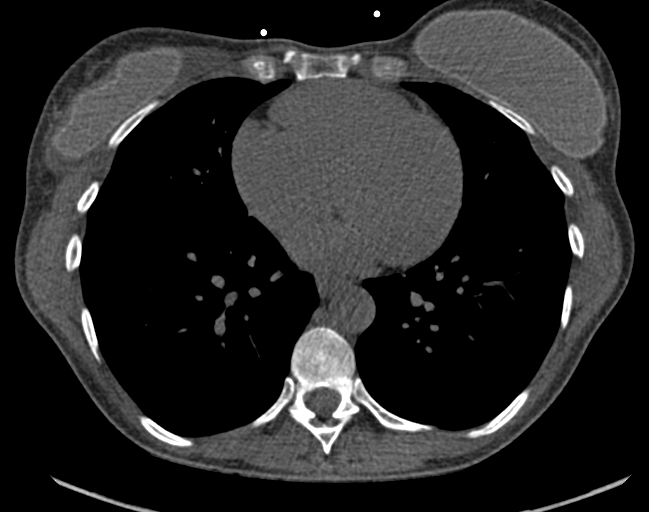
[im 53/80  vessel]
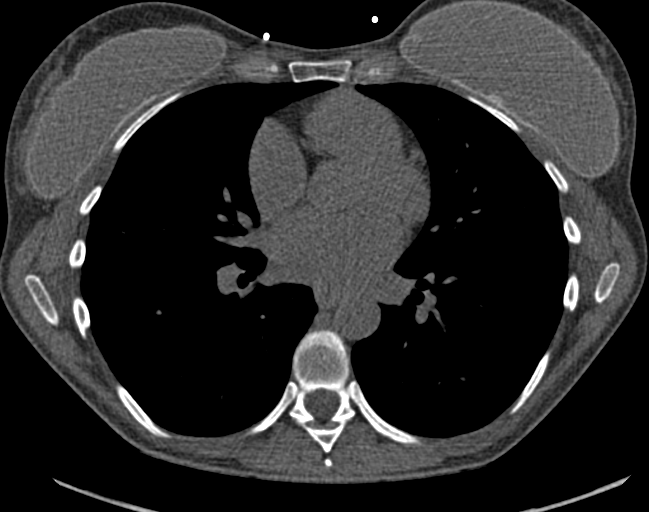
[im 66/80  vessel]
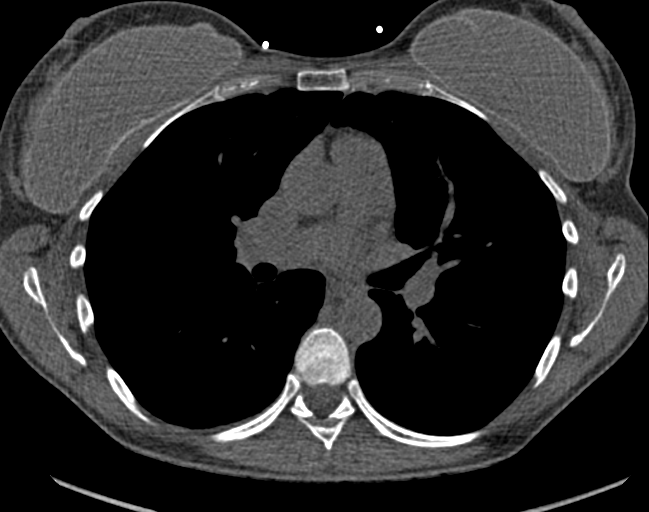
[im 66/80  lung]
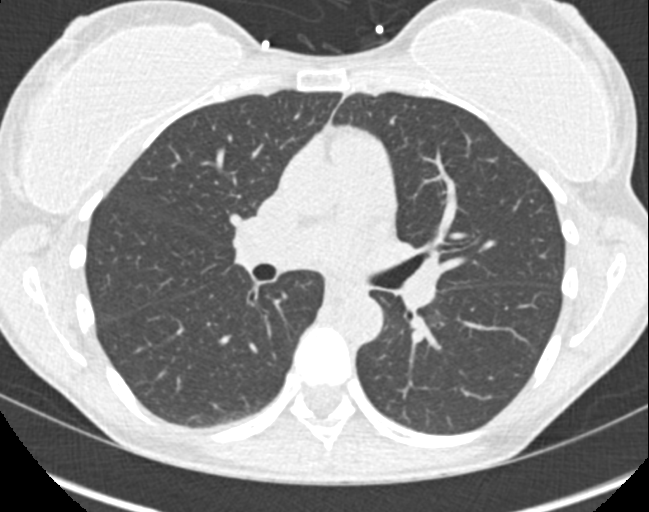

[Series 9: calcium scoring 2.00 br60 bestdiast 69% lungs · axial · 0.48mm/px · z∈[+1489,+1593]mm · 5 of 80 slices shown]
[im 14/80  vessel]
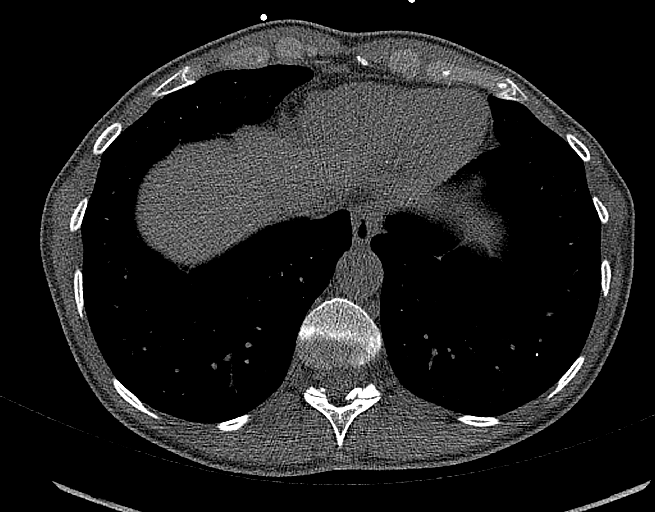
[im 27/80  vessel]
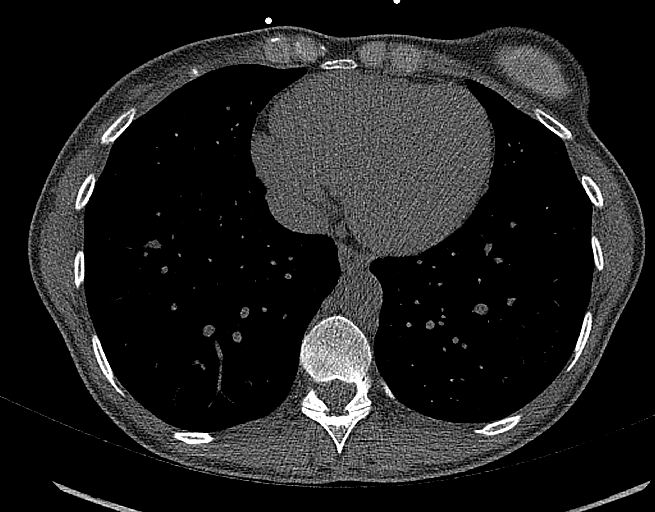
[im 40/80  vessel]
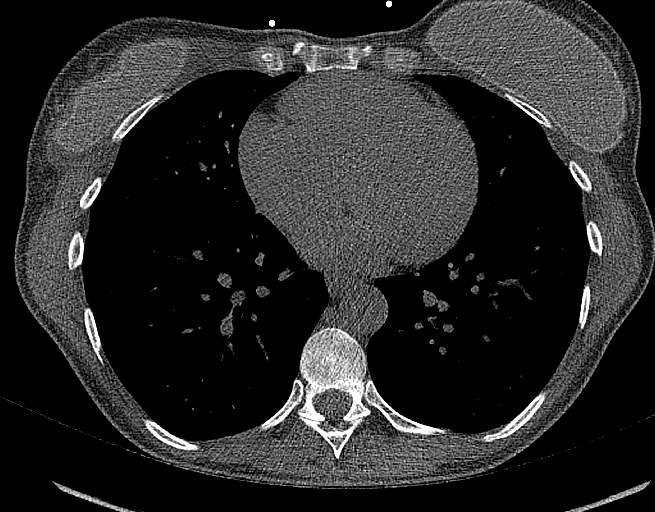
[im 53/80  vessel]
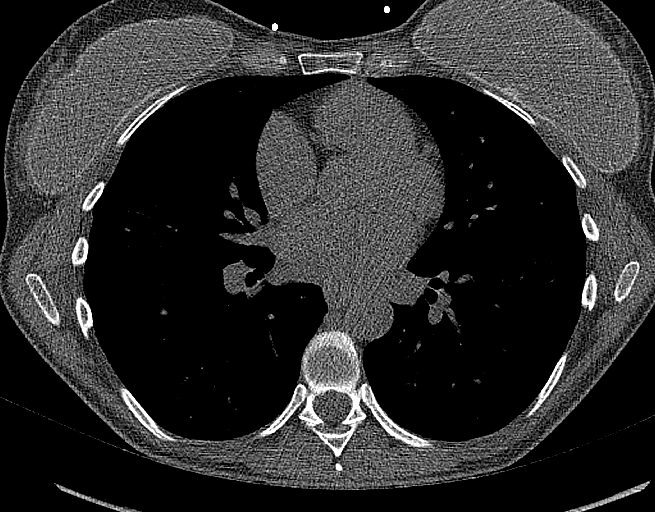
[im 66/80  vessel]
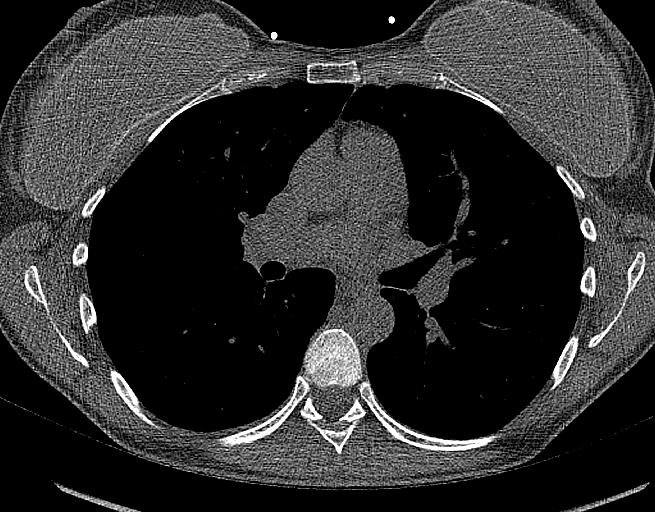

[14 of 20 positions shown; findings below may reference images not displayed]

FINDINGS: CORONARY CALCIUM SCORES:

Left Main: 0

LAD: 0

LCx: 0

RCA: 0

Total Agatston Score: 0

[HOSPITAL] percentile: 0

AORTA MEASUREMENTS:

Ascending Aorta: 26 mm

Descending Aorta: 21 mm

OTHER FINDINGS:


## 2022-07-20 DIAGNOSIS — E785 Hyperlipidemia, unspecified: Secondary | ICD-10-CM | POA: Diagnosis not present

## 2022-07-27 DIAGNOSIS — D72819 Decreased white blood cell count, unspecified: Secondary | ICD-10-CM | POA: Diagnosis not present

## 2022-07-27 DIAGNOSIS — Z1331 Encounter for screening for depression: Secondary | ICD-10-CM | POA: Diagnosis not present

## 2022-07-27 DIAGNOSIS — Z0000 Encounter for general adult medical examination without abnormal findings: Secondary | ICD-10-CM | POA: Diagnosis not present

## 2022-07-27 DIAGNOSIS — R82998 Other abnormal findings in urine: Secondary | ICD-10-CM | POA: Diagnosis not present

## 2022-10-21 ENCOUNTER — Ambulatory Visit
Admission: RE | Admit: 2022-10-21 | Discharge: 2022-10-21 | Disposition: A | Discharge status: Home / Self Care | Payer: BC Managed Care – PPO | Source: Ambulatory Visit | Attending: Sports Medicine | Admitting: Sports Medicine

## 2022-10-21 ENCOUNTER — Ambulatory Visit: Payer: BC Managed Care – PPO | Admitting: Sports Medicine

## 2022-10-21 VITALS — BP 106/60 | Ht 66.0 in | Wt 135.0 lb

## 2022-10-21 DIAGNOSIS — S99922A Unspecified injury of left foot, initial encounter: Secondary | ICD-10-CM | POA: Diagnosis not present

## 2022-10-21 DIAGNOSIS — M25572 Pain in left ankle and joints of left foot: Secondary | ICD-10-CM

## 2022-10-21 DIAGNOSIS — M7732 Calcaneal spur, left foot: Secondary | ICD-10-CM | POA: Diagnosis not present

## 2022-10-21 NOTE — Progress Notes (Signed)
   Subjective:    Patient ID: Mallory Ramirez, female    DOB: May 19, 1976, 46 y.o.   MRN: [id number]  HPI chief complaint: Left foot pain  Patient presents today complaining of approximately 1 month of left foot pain.  Pain began after she struck the dorsum of her foot on her refrigerator door.  She localizes her pain to the  midfoot.  No significant swelling but she has had persistent pain, especially with pressure over the area.  She is current concerned about possible fracture.  She would also like to have a couple of pairs of orthotics constructed today.  She has had orthotics in the past and done well with them.    Review of Systems As above    Objective:   Physical Exam  Well-developed, well-nourished.  No acute distress  Left foot: She is tender to palpation along the midfoot but no obvious swelling or ecchymosis.  Full ankle range of motion.  Good pulses.  She is able to ambulate without a significant limp.  X-rays of the left foot including AP, lateral, and oblique view show no obvious fracture.  She does have some mild spurring at the talonavicular joint.      Assessment & Plan:   Left foot pain secondary to contusion versus mild midfoot DJD  Reassurance regarding her x-rays.  She may continue with all activity as tolerated using pain as her guide.  We did construct her 2 new pairs of orthotics as below.  She will follow-up for ongoing or recalcitrant issues.  Patient was fitted for a : standard, cushioned, semi-rigid orthotic. The orthotic was heated and afterward the patient stood on the orthotic blank positioned on the orthotic stand. The patient was positioned in subtalar neutral position and 10 degrees of ankle dorsiflexion in a weight bearing stance. After completion of molding, a stable base was applied to the orthotic blank. The blank was ground to a stable position for weight bearing. Size: 9 Base: Blue EVA Posting: None Additional orthotic padding:  None   Patient was fitted for a : standard, cushioned, semi-rigid orthotic. The orthotic was heated and afterward the patient stood on the orthotic blank positioned on the orthotic stand. The patient was positioned in subtalar neutral position and 10 degrees of ankle dorsiflexion in a weight bearing stance. After completion of molding, a stable base was applied to the orthotic blank. The blank was ground to a stable position for weight bearing. Size: 9 dress orthotics Base: None Posting: None Additional orthotic padding: None  This note was dictated using Dragon naturally speaking software and may contain errors in syntax, spelling, or content which have not been identified prior to signing this note.

## 2022-12-08 DIAGNOSIS — Z01419 Encounter for gynecological examination (general) (routine) without abnormal findings: Secondary | ICD-10-CM | POA: Diagnosis not present

## 2022-12-08 DIAGNOSIS — Z1231 Encounter for screening mammogram for malignant neoplasm of breast: Secondary | ICD-10-CM | POA: Diagnosis not present

## 2022-12-08 DIAGNOSIS — Z6822 Body mass index (BMI) 22.0-22.9, adult: Secondary | ICD-10-CM | POA: Diagnosis not present

## 2022-12-09 ENCOUNTER — Ambulatory Visit
Admission: RE | Admit: 2022-12-09 | Discharge: 2022-12-09 | Disposition: A | Discharge status: Home / Self Care | Payer: BC Managed Care – PPO | Source: Ambulatory Visit | Attending: Sports Medicine | Admitting: Sports Medicine

## 2022-12-09 ENCOUNTER — Ambulatory Visit: Payer: BC Managed Care – PPO | Admitting: Sports Medicine

## 2022-12-09 VITALS — BP 118/78 | Ht 66.5 in | Wt 137.0 lb

## 2022-12-09 DIAGNOSIS — M25572 Pain in left ankle and joints of left foot: Secondary | ICD-10-CM

## 2022-12-09 NOTE — Assessment & Plan Note (Signed)
Patient presents with acute left ankle pain and swelling after tripping during a run last night. Exam notable for mild tenderness to palpation of posterior aspect of lateral malleolus, limited ROM, and positive anterior drawer indicating injury to ATFL. Since she is unable to ambulate and has tenderness at posterior lateral malleolus, will obtain ankle XR to rule out fracture and give patient lace-up boot and crutches. Will call with results. If XR is negative for fracture, instructed patient to perform ROM exercises and stop using boot, continue crutches, and follow-up in 3 weeks. If XR shows fracture, patient will remain in boot and follow-up in 1 week.

## 2022-12-09 NOTE — Progress Notes (Addendum)
   Established Patient Office Visit  Subjective   Patient ID: Mallory Ramirez, female    DOB: 31-Mar-1976  Age: 47 y.o. MRN: [id number]  CC: Left ankle pain   HPI: Mallory Ramirez is a 47 year old female with a history of left ankle instability who presents with acute left ankle pain and swelling. She was running last night around 7 PM with a group from [place] when she stepped on a large round seed (? sweet gum ball) and tripped to her left. She managed to grab her friend and prevent herself from falling. She heard a pop but is unsure if it was her ankle or the seed. She was unable to continue running and managed to limp back to a nearby ice cream store to wait for her friend to pick her up. Her ankle was swollen last night and she iced and elevated it. This morning she put her ankle in a boot from a prior injury and was limping with significant pain.    Objective:    BP 118/78   Ht 5' 6.5" (1.689 m)   Wt 137 lb (62.1 kg)   BMI 21.78 kg/m  Vital signs reviewed.   Physical Exam  Left Ankle: Limping gait with boot present. Swelling present at lateral dorsal surface of ankle, no visible erythema or ecchymosis. Mild tenderness to palpation of posterior aspect of lateral malleolus. Extreme tenderness to palpation distal to lateral malleolus. Nontender to palpation of base of 5th metatarsal, medial malleolus, navicular, and calcaneus. Limited ROM throughout, plantarflexion and eversion illicit the most discomfort. Positive anterior drawer. Negative talar tilt. Negative squeeze test. Unable to ambulate 4 steps without boot.     Assessment & Plan:   Problem List Items Addressed This Visit       Other   Acute left ankle pain - Primary    Patient presents with acute left ankle pain and swelling after tripping during a run last night. Exam notable for mild tenderness to palpation of posterior aspect of lateral malleolus, limited ROM, and positive anterior drawer indicating injury to ATFL.  Since she is unable to ambulate and has tenderness at posterior lateral malleolus, will obtain ankle XR to rule out fracture and give patient lace-up boot and crutches. Will call with results. If XR is negative for fracture, instructed patient to perform ROM exercises and stop using boot, continue crutches, and follow-up in 3 weeks. If XR shows fracture, patient will remain in boot and follow-up in 1 week.       Relevant Orders   DG Ankle Complete Left   Sabino Dick, MS4 [facility]   Patient seen and evaluated with the medical student.  I agree with the above plan of care.  X-rays show a nondisplaced avulsion type fracture through the distal fibula.  This does not involve the mortise and is very stable.  Patient was notified of x-ray results via telephone and instructed to remain in her cam walker until follow-up with me in 1 week.  I will plan on discontinuing her cam walker at that time and placing her into an Aircast.  This note was dictated using Dragon naturally speaking software and may contain errors in syntax, spelling, or content which have not been identified prior to signing this note.

## 2022-12-16 ENCOUNTER — Ambulatory Visit: Payer: BC Managed Care – PPO | Admitting: Sports Medicine

## 2022-12-16 VITALS — BP 100/70 | Ht 67.0 in | Wt 137.0 lb

## 2022-12-16 DIAGNOSIS — M25572 Pain in left ankle and joints of left foot: Secondary | ICD-10-CM | POA: Diagnosis not present

## 2022-12-16 DIAGNOSIS — S82832D Other fracture of upper and lower end of left fibula, subsequent encounter for closed fracture with routine healing: Secondary | ICD-10-CM | POA: Diagnosis not present

## 2022-12-16 NOTE — Progress Notes (Signed)
   Subjective:    Patient ID: Mallory Ramirez, female    DOB: May 24, 1976, 47 y.o.   MRN: [id number]  HPI  [name] presents today for follow-up on her nondisplaced left distal fibular fracture.  She has been able to wean off of her crutches and is ambulating only in her cam boot.  Although she does still have some pain and swelling, it is less than last week.  She is anxious to return to the gym.   Review of Systems As above    Objective:   Physical Exam  Well-developed, well-nourished.  No acute distress  Left ankle: There is a mild amount of diffuse soft tissue swelling as well as ecchymosis.  Limited range of motion secondary to swelling.  Still some tenderness to palpation over the distal fibula.  Good pulses.      Assessment & Plan:   1 week status post nondisplaced left distal fibular fracture  Patient will transition out of her cam walker into an Aircast.  She will start range of motion exercises and will follow-up with me again in 3 weeks.  We will check a follow-up x-ray at that time.  She may return to some limited activity at the gym using pain as her guide.  No running yet but she may use an exercise bike if it is not too uncomfortable.  She will notify me with any questions or concerns in the interim.  This note was dictated using Dragon naturally speaking software and may contain errors in syntax, spelling, or content which have not been identified prior to signing this note.

## 2022-12-27 ENCOUNTER — Other Ambulatory Visit: Payer: Self-pay | Admitting: *Deleted

## 2022-12-27 DIAGNOSIS — I824Y9 Acute embolism and thrombosis of unspecified deep veins of unspecified proximal lower extremity: Secondary | ICD-10-CM

## 2022-12-27 DIAGNOSIS — I871 Compression of vein: Secondary | ICD-10-CM

## 2023-01-03 ENCOUNTER — Ambulatory Visit (HOSPITAL_COMMUNITY)
Admission: RE | Admit: 2023-01-03 | Discharge: 2023-01-03 | Disposition: A | Discharge status: Home / Self Care | Payer: BC Managed Care – PPO | Source: Ambulatory Visit | Attending: Vascular Surgery | Admitting: Vascular Surgery

## 2023-01-03 DIAGNOSIS — I824Y9 Acute embolism and thrombosis of unspecified deep veins of unspecified proximal lower extremity: Secondary | ICD-10-CM | POA: Diagnosis not present

## 2023-01-03 DIAGNOSIS — I871 Compression of vein: Secondary | ICD-10-CM | POA: Diagnosis not present

## 2023-01-04 ENCOUNTER — Ambulatory Visit
Admission: RE | Admit: 2023-01-04 | Discharge: 2023-01-04 | Disposition: A | Discharge status: Home / Self Care | Payer: BC Managed Care – PPO | Source: Ambulatory Visit | Attending: Sports Medicine | Admitting: Sports Medicine

## 2023-01-04 ENCOUNTER — Ambulatory Visit: Payer: BC Managed Care – PPO | Admitting: Sports Medicine

## 2023-01-04 VITALS — BP 100/70 | Ht 66.0 in | Wt 134.0 lb

## 2023-01-04 DIAGNOSIS — S8262XA Displaced fracture of lateral malleolus of left fibula, initial encounter for closed fracture: Secondary | ICD-10-CM | POA: Diagnosis not present

## 2023-01-04 DIAGNOSIS — S82832D Other fracture of upper and lower end of left fibula, subsequent encounter for closed fracture with routine healing: Secondary | ICD-10-CM | POA: Diagnosis not present

## 2023-01-04 DIAGNOSIS — M25572 Pain in left ankle and joints of left foot: Secondary | ICD-10-CM

## 2023-01-04 NOTE — Progress Notes (Addendum)
   Subjective:    Patient ID: Mallory Ramirez, female    DOB: [birth year], 47 y.o.   MRN: [id number]  HPI  Basya presents today for follow-up on an isolated transverse distal fibular fracture of the left ankle.  Overall, she is doing well.  She is ambulating without pain.  She does endorse pain with coming downstairs.  She also notices that the pressure from the Aircast is uncomfortable over the fracture site.  She is leaving soon for a trip [place] and will be returning in early April.   Review of Systems As above    Objective:   Physical Exam  Well-developed, well-nourished.  No acute distress  Left ankle: There is some mild residual swelling near the lateral malleolus and some resolving ecchymosis.  Nearly full range of motion.  There is still some tenderness to palpation directly over the fracture site.  Good pulses.  Ambulating without a limp.  X-rays of the left ankle including AP, lateral, and mortise views show no change in fracture position.      Assessment & Plan:   4 weeks status post nondisplaced transverse distal fibular fracture, left ankle  Clinically, Doreather is doing well.  Since her Aircast is uncomfortable, I did allow her to use either a body helix compression sleeve or an ASO instead (she has both of these at home).  She will continue to refrain from running until her follow-up visit with me on April 9.  She may continue spin classes and weight training using pain as her guide.  She was educated today in strengthening and proprioception exercises and will introduce those as part of her ankle rehabilitation as her pain improves.  We will plan on checking a follow-up x-ray at her next visit.  She will message me through [place] with any questions or concerns that she may have when traveling overseas.  This note was dictated using Dragon naturally speaking software and may contain errors in syntax, spelling, or content which have not been identified prior to signing  this note.

## 2023-01-05 ENCOUNTER — Ambulatory Visit: Payer: BC Managed Care – PPO | Admitting: Vascular Surgery

## 2023-01-05 ENCOUNTER — Encounter: Payer: Self-pay | Admitting: Vascular Surgery

## 2023-01-05 VITALS — BP 119/76 | HR 73 | Temp 98.3°F | Resp 20 | Ht 66.0 in | Wt 137.0 lb

## 2023-01-05 DIAGNOSIS — I871 Compression of vein: Secondary | ICD-10-CM

## 2023-01-05 NOTE — Progress Notes (Signed)
Patient ID: Mallory Ramirez, female   DOB: 04-26-1976, 47 y.o.   MRN: [id number]  Reason for Consult: No chief complaint on file.   Referred by Crist Infante, MD  Subjective:     HPI:  Mallory Ramirez is a 47 y.o. 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  The time she was having left lower quadrant pain.  She states this has not been present for at least a year now.  She has been taking hormone replacement therapy.  She has no lower extremity swelling or varicosities.  She denies any changes in her urine specifically blood. She has been in her usual level of activity although did have left ankle small fracture recently from running.  Past Medical History:  Diagnosis Date   HLD (hyperlipidemia)    Hypertension    Seasonal allergies    Family History  Problem Relation Age of Onset   Heart attack Father    Colon polyps Neg Hx    Colon cancer Neg Hx    Esophageal cancer Neg Hx    Stomach cancer Neg Hx    Rectal cancer Neg Hx    Past Surgical History:  Procedure Laterality Date   CESAREAN SECTION     ROTATOR CUFF REPAIR Right 2020    Short Social History:  Social History   Tobacco Use   Smoking status: Never   Smokeless tobacco: Never  Substance Use Topics   Alcohol use: Yes    Alcohol/week: 2.0 - 3.0 standard drinks of alcohol    Types: 2 - 3 Standard drinks or equivalent per week    No Known Allergies  Current Outpatient Medications  Medication Sig Dispense Refill   Cholecalciferol (VITAMIN D3 PO) Take 1 tablet by mouth daily at 6 (six) AM.     Coenzyme Q10 (COQ10 PO) Take 1 tablet by mouth daily at 6 (six) AM.     doxycycline (ADOXA) 50 MG tablet Take 50 mg by mouth 2 (two) times daily.     FLUoxetine (PROZAC) 10 MG capsule Take 10 mg by mouth daily.     Omega-3 Fatty Acids (OMEGA 3 PO) Take 1 tablet by mouth daily at 6 (six) AM.     rosuvastatin (CRESTOR) 20 MG tablet Take 20 mg by mouth daily.     No current facility-administered  medications for this visit.    Review of Systems  Constitutional:  Constitutional negative. HENT: HENT negative.  Eyes: Eyes negative.  Respiratory: Respiratory negative.  Cardiovascular: Cardiovascular negative.  GI: Gastrointestinal negative.  Musculoskeletal: Musculoskeletal negative.  Skin: Skin negative.  Neurological: Neurological negative. Hematologic: Hematologic/lymphatic negative.  Psychiatric: Psychiatric negative.        Objective:  Objective   Vitals:   01/05/23 1430  BP: 119/76  Pulse: 73  Resp: 20  Temp: 98.3 F (36.8 C)  SpO2: 97%     Physical Exam HENT:     Head: Normocephalic.     Nose: Nose normal.     Mouth/Throat:     Mouth: Mucous membranes are moist.  Eyes:     Pupils: Pupils are equal, round, and reactive to light.  Cardiovascular:     Rate and Rhythm: Normal rate.     Pulses: Normal pulses.  Pulmonary:     Effort: Pulmonary effort is normal.  Abdominal:     General: Abdomen is flat.  Musculoskeletal:     Cervical back: Normal range of motion.     Right lower leg: No edema.  Left lower leg: No edema.  Skin:    General: Skin is warm.     Capillary Refill: Capillary refill takes less than 2 seconds.  Neurological:     General: No focal deficit present.     Mental Status: She is alert.  Psychiatric:        Mood and Affect: Mood normal.        Thought Content: Thought content normal.        Judgment: Judgment normal.     Data: IVC/Iliac Findings:  +----------+------+--------+--------+    IVC    PatentThrombusComments  +----------+------+--------+--------+  IVC Prox  patent                  +----------+------+--------+--------+  IVC Mid   patent                  +----------+------+--------+--------+  IVC Distalpatent                  +----------+------+--------+--------+    Summary:  IVC/Iliac: There is no evidence of thrombus involving the IVC. The Prox,  mid, and distal Renal V are widely  patnet. The Left Renal V confluence  with the IVC was not able to be visualized.         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  Duplex today was normal although the renal vein on the left could not be visualized.  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  Given that she is asymptomatic now would not recommend any intervention and she can see me on an as-needed basis.     Waynetta Sandy MD [facility]

## 2023-01-24 DIAGNOSIS — Z1329 Encounter for screening for other suspected endocrine disorder: Secondary | ICD-10-CM | POA: Diagnosis not present

## 2023-02-22 ENCOUNTER — Ambulatory Visit
Admission: RE | Admit: 2023-02-22 | Discharge: 2023-02-22 | Disposition: A | Discharge status: Home / Self Care | Payer: BC Managed Care – PPO | Source: Ambulatory Visit | Attending: Sports Medicine | Admitting: Sports Medicine

## 2023-02-22 ENCOUNTER — Ambulatory Visit: Payer: BC Managed Care – PPO | Admitting: Sports Medicine

## 2023-02-22 VITALS — BP 108/78 | Ht 66.0 in | Wt 136.7 lb

## 2023-02-22 DIAGNOSIS — S82832D Other fracture of upper and lower end of left fibula, subsequent encounter for closed fracture with routine healing: Secondary | ICD-10-CM

## 2023-02-22 DIAGNOSIS — H43812 Vitreous degeneration, left eye: Secondary | ICD-10-CM | POA: Diagnosis not present

## 2023-02-22 NOTE — Progress Notes (Signed)
   Subjective:    Patient ID: Talbert Forest, female    DOB: June 13, 1976, 47 y.o.   MRN: [id number]  HPI  Patient presents today for follow-up on a left distal fibular avulsion fracture.  She is doing well.  She has returned to all forms of exercising with minimal discomfort.  She does endorse a little bit of pain along the area of the peroneal tendons but it is not severe.  Her injury occurred almost 3 months ago.    Review of Systems As above    Objective:   Physical Exam  Well-developed, fit appearing.  No acute distress  Left ankle: Full painless range of motion.  No effusion.  No soft tissue swelling.  No tenderness to palpation over the distal fibula.  She does have a 2+ talar tilt and a positive anterior drawer.  Slight discomfort along the area of the peroneal tendons posterior to the lateral malleolus.  Good pulses.  X-rays of the left ankle including AP, lateral, and mortise views still show a persistent fracture line through the distal fibula.      Assessment & Plan:   29-month status post transverse distal fibular fracture, left ankle  Although the x-ray does show persistent fracture line, patient clinically is doing very well.  With the location of the fracture being below the mortise, I do not believe any further treatment is necessary.  I would like for Manar to be a little more diligent about her ankle rehabilitation at home and I have encouraged her to continue with a body helix compression sleeve for stability when exercising for the next 3 months.  At this point I will discharge her from my care to follow-up as needed.  This note was dictated using Dragon naturally speaking software and may contain errors in syntax, spelling, or content which have not been identified prior to signing this note.

## 2023-03-15 DIAGNOSIS — H04123 Dry eye syndrome of bilateral lacrimal glands: Secondary | ICD-10-CM | POA: Diagnosis not present

## 2023-03-30 DIAGNOSIS — M79642 Pain in left hand: Secondary | ICD-10-CM | POA: Diagnosis not present

## 2023-03-30 DIAGNOSIS — M79641 Pain in right hand: Secondary | ICD-10-CM | POA: Diagnosis not present

## 2023-03-31 DIAGNOSIS — R079 Chest pain, unspecified: Secondary | ICD-10-CM | POA: Diagnosis not present

## 2023-03-31 DIAGNOSIS — R5383 Other fatigue: Secondary | ICD-10-CM | POA: Diagnosis not present

## 2023-04-05 DIAGNOSIS — M79642 Pain in left hand: Secondary | ICD-10-CM | POA: Diagnosis not present

## 2023-04-05 DIAGNOSIS — M79641 Pain in right hand: Secondary | ICD-10-CM | POA: Diagnosis not present

## 2023-04-07 DIAGNOSIS — M79642 Pain in left hand: Secondary | ICD-10-CM | POA: Diagnosis not present

## 2023-04-07 DIAGNOSIS — M79641 Pain in right hand: Secondary | ICD-10-CM | POA: Diagnosis not present

## 2023-04-12 DIAGNOSIS — M79641 Pain in right hand: Secondary | ICD-10-CM | POA: Diagnosis not present

## 2023-04-12 DIAGNOSIS — M79642 Pain in left hand: Secondary | ICD-10-CM | POA: Diagnosis not present

## 2023-04-14 DIAGNOSIS — M79641 Pain in right hand: Secondary | ICD-10-CM | POA: Diagnosis not present

## 2023-04-14 DIAGNOSIS — M79642 Pain in left hand: Secondary | ICD-10-CM | POA: Diagnosis not present

## 2023-04-19 DIAGNOSIS — M79642 Pain in left hand: Secondary | ICD-10-CM | POA: Diagnosis not present

## 2023-04-19 DIAGNOSIS — M79641 Pain in right hand: Secondary | ICD-10-CM | POA: Diagnosis not present

## 2023-05-09 DIAGNOSIS — M79641 Pain in right hand: Secondary | ICD-10-CM | POA: Diagnosis not present

## 2023-05-09 DIAGNOSIS — M79642 Pain in left hand: Secondary | ICD-10-CM | POA: Diagnosis not present

## 2023-05-16 DIAGNOSIS — M79642 Pain in left hand: Secondary | ICD-10-CM | POA: Diagnosis not present

## 2023-05-16 DIAGNOSIS — M79641 Pain in right hand: Secondary | ICD-10-CM | POA: Diagnosis not present

## 2023-06-01 DIAGNOSIS — M79642 Pain in left hand: Secondary | ICD-10-CM | POA: Diagnosis not present

## 2023-06-01 DIAGNOSIS — M79641 Pain in right hand: Secondary | ICD-10-CM | POA: Diagnosis not present

## 2023-06-08 DIAGNOSIS — M79642 Pain in left hand: Secondary | ICD-10-CM | POA: Diagnosis not present

## 2023-06-08 DIAGNOSIS — M542 Cervicalgia: Secondary | ICD-10-CM | POA: Diagnosis not present

## 2023-06-08 DIAGNOSIS — M79641 Pain in right hand: Secondary | ICD-10-CM | POA: Diagnosis not present

## 2023-09-08 DIAGNOSIS — E785 Hyperlipidemia, unspecified: Secondary | ICD-10-CM | POA: Diagnosis not present

## 2023-09-08 DIAGNOSIS — Z1389 Encounter for screening for other disorder: Secondary | ICD-10-CM | POA: Diagnosis not present

## 2023-09-08 DIAGNOSIS — Z0001 Encounter for general adult medical examination with abnormal findings: Secondary | ICD-10-CM | POA: Diagnosis not present

## 2023-09-08 DIAGNOSIS — Z1212 Encounter for screening for malignant neoplasm of rectum: Secondary | ICD-10-CM | POA: Diagnosis not present

## 2023-09-16 DIAGNOSIS — Z23 Encounter for immunization: Secondary | ICD-10-CM | POA: Diagnosis not present

## 2023-09-16 DIAGNOSIS — R82998 Other abnormal findings in urine: Secondary | ICD-10-CM | POA: Diagnosis not present

## 2023-09-16 DIAGNOSIS — M25511 Pain in right shoulder: Secondary | ICD-10-CM | POA: Diagnosis not present

## 2023-09-16 DIAGNOSIS — Z0000 Encounter for general adult medical examination without abnormal findings: Secondary | ICD-10-CM | POA: Diagnosis not present

## 2023-09-16 DIAGNOSIS — D72819 Decreased white blood cell count, unspecified: Secondary | ICD-10-CM | POA: Diagnosis not present

## 2023-12-28 DIAGNOSIS — Z1231 Encounter for screening mammogram for malignant neoplasm of breast: Secondary | ICD-10-CM | POA: Diagnosis not present

## 2023-12-28 DIAGNOSIS — Z01419 Encounter for gynecological examination (general) (routine) without abnormal findings: Secondary | ICD-10-CM | POA: Diagnosis not present

## 2023-12-28 DIAGNOSIS — Z1331 Encounter for screening for depression: Secondary | ICD-10-CM | POA: Diagnosis not present

## 2024-01-16 ENCOUNTER — Other Ambulatory Visit (HOSPITAL_COMMUNITY): Payer: Self-pay | Admitting: Nurse Practitioner

## 2024-01-16 DIAGNOSIS — I8002 Phlebitis and thrombophlebitis of superficial vessels of left lower extremity: Secondary | ICD-10-CM | POA: Diagnosis not present

## 2024-01-16 DIAGNOSIS — E785 Hyperlipidemia, unspecified: Secondary | ICD-10-CM | POA: Diagnosis not present

## 2024-01-17 ENCOUNTER — Ambulatory Visit (HOSPITAL_COMMUNITY)
Admission: RE | Admit: 2024-01-17 | Discharge: 2024-01-17 | Disposition: A | Discharge status: Home / Self Care | Source: Ambulatory Visit | Attending: Nurse Practitioner | Admitting: Nurse Practitioner

## 2024-01-17 DIAGNOSIS — I8002 Phlebitis and thrombophlebitis of superficial vessels of left lower extremity: Secondary | ICD-10-CM | POA: Insufficient documentation

## 2024-03-16 ENCOUNTER — Emergency Department (HOSPITAL_BASED_OUTPATIENT_CLINIC_OR_DEPARTMENT_OTHER)
Admission: EM | Admit: 2024-03-16 | Discharge: 2024-03-16 | Disposition: A | Discharge status: Home / Self Care | Attending: Emergency Medicine | Admitting: Emergency Medicine

## 2024-03-16 ENCOUNTER — Encounter (HOSPITAL_BASED_OUTPATIENT_CLINIC_OR_DEPARTMENT_OTHER): Payer: Self-pay

## 2024-03-16 DIAGNOSIS — H3322 Serous retinal detachment, left eye: Secondary | ICD-10-CM

## 2024-03-16 DIAGNOSIS — H547 Unspecified visual loss: Secondary | ICD-10-CM | POA: Diagnosis not present

## 2024-03-16 DIAGNOSIS — H338 Other retinal detachments: Secondary | ICD-10-CM | POA: Insufficient documentation

## 2024-03-16 MED ORDER — TROPICAMIDE 0.5 % OP SOLN
1.0000 [drp] | Freq: Once | OPHTHALMIC | Status: DC
  Filled 2024-03-16: qty 15

## 2024-03-16 MED ORDER — PHENYLEPHRINE HCL 2.5 % OP SOLN
1.0000 [drp] | Freq: Once | OPHTHALMIC | Status: AC
  Administered 2024-03-16: 1 [drp] via OPHTHALMIC
  Filled 2024-03-16: qty 2

## 2024-03-16 MED ORDER — TROPICAMIDE 1 % OP SOLN
1.0000 [drp] | Freq: Once | OPHTHALMIC | Status: AC
  Administered 2024-03-16: 1 [drp] via OPHTHALMIC
  Filled 2024-03-16: qty 15

## 2024-03-16 NOTE — ED Notes (Signed)
 ED Provider at bedside.

## 2024-03-16 NOTE — ED Provider Notes (Signed)
 [place] EMERGENCY DEPARTMENT AT [facility] Provider Note   CSN: [id number] Arrival date & time: 03/16/24  2025     History  Chief Complaint  Patient presents with   Eye Problem    Arleatha SAMPAIO Ernestene Headings is a 48 y.o. female.  HPI   48 year old female presents to the emergency department with vision loss in the left eye.  Patient states around noon today she noticed shadowing of the inner vision of her left eye.  She did not pay much attention to it.  This progressed throughout the day until she covered her right eye and noted that she only had peripheral vision in her left eye.  She has no eye pain or redness.  No headache or temporal tenderness.  She denies any focal neurologic complaints.  She does wear contacts and follows with optometry yearly.  Home Medications Prior to Admission medications   Medication Sig Start Date End Date Taking? Authorizing Provider  Cholecalciferol (VITAMIN D3 PO) Take 1 tablet by mouth daily at 6 (six) AM.    [provider]  Coenzyme Q10 (COQ10 PO) Take 1 tablet by mouth daily at 6 (six) AM.    [provider]  Omega-3 Fatty Acids (OMEGA 3 PO) Take 1 tablet by mouth daily at 6 (six) AM.    [provider]  rosuvastatin (CRESTOR) 20 MG tablet Take 20 mg by mouth daily.    [provider]      Allergies    Patient has no known allergies.    Review of Systems   Review of Systems  Constitutional:  Negative for fever.  Eyes:  Positive for visual disturbance. Negative for pain, discharge, redness and itching.  Respiratory:  Negative for shortness of breath.   Cardiovascular:  Negative for chest pain.  Gastrointestinal:  Negative for abdominal pain, diarrhea and vomiting.  Skin:  Negative for rash.  Neurological:  Negative for seizures, facial asymmetry, speech difficulty, weakness and headaches.    Physical Exam Updated Vital Signs BP 115/82   Pulse 61   Temp 98 F (36.7 C)   Resp 20   Ht  5\' 6"  (1.676 m)   Wt 60 kg   SpO2 96%   BMI 21.35 kg/m  Physical Exam Vitals and nursing note reviewed.  Constitutional:      Appearance: Normal appearance.  HENT:     Head: Normocephalic.     Mouth/Throat:     Mouth: Mucous membranes are moist.  Eyes:     Extraocular Movements: Extraocular movements intact.     Conjunctiva/sclera: Conjunctivae normal.     Pupils: Pupils are equal, round, and reactive to light.     Comments: Ultrasound shows retinal detachment, please see media pictures  Cardiovascular:     Rate and Rhythm: Normal rate.  Pulmonary:     Effort: Pulmonary effort is normal. No respiratory distress.  Abdominal:     Palpations: Abdomen is soft.     Tenderness: There is no abdominal tenderness.  Skin:    General: Skin is warm.  Neurological:     Mental Status: She is alert and oriented to person, place, and time. Mental status is at baseline.  Psychiatric:        Mood and Affect: Mood normal.           ED Results / Procedures / Treatments   Labs (all labs ordered are listed, but only abnormal results are displayed) Labs Reviewed - No data to display  EKG None  Radiology No results found.  Procedures Procedures    Medications Ordered in ED Medications  phenylephrine  (MYDFRIN) 2.5 % ophthalmic solution 1 drop (1 drop Both Eyes Given 03/16/24 2204)  tropicamide  (MYDRIACYL ) 1 % ophthalmic solution 1 drop (1 drop Both Eyes Given 03/16/24 2204)    ED Course/ Medical Decision Making/ A&P                                 Medical Decision Making Risk Prescription drug management.   48 year old female presents emergency department with concern for vision loss in the left eye.  No trauma, eye pain, redness, headache/temporal pain.  Vitals are normal and stable.  Patient has significant loss of her medial left eye visual field with really only the left temporal superior vision intact.  Right eye seems unaffected.  Bedside ultrasound identifies a left  retinal detachment.  Consulted with ophthalmologist, Dr.Lyles.  He came in for a dilated eye exam.  There appears to be involvement of the macula.  He will reach out to retina specialist and call the patient tomorrow with follow-up and plans for further treatment.  Patient has been updated and understands.  Patient at this time appears safe and stable for discharge and close outpatient follow up. Discharge plan and strict return to ED precautions discussed, patient verbalizes understanding and agreement.        Final Clinical Impression(s) / ED Diagnoses Final diagnoses:  Left retinal detachment    Rx / DC Orders ED Discharge Orders     None         Flonnie Humphrey, DO 03/16/24 2312

## 2024-03-16 NOTE — ED Notes (Signed)
 Dr Carylon Claude informed of patient assessment

## 2024-03-16 NOTE — ED Triage Notes (Signed)
 Onset at 1200 hr loss of vision inner half of left eye.

## 2024-03-16 NOTE — Consult Note (Signed)
 Reason for consult:  HPI: Mallory Ramirez Mallory Ramirez is an 48 y.o. female we are asked to see for loss of vision OS.  The patient reports over the last 24 hrs she's had progressive loss of vision OS.  She mildly endorses a period of flashes and possible floaters several weeks ago.    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  She has had prior LASIK OS.     Past Medical History:  Diagnosis Date   HLD (hyperlipidemia)    Hypertension    Seasonal allergies    Past Surgical History:  Procedure Laterality Date   CESAREAN SECTION     ROTATOR CUFF REPAIR Right 2020   Family History  Problem Relation Age of Onset   Heart attack Father    Colon polyps Neg Hx    Colon cancer Neg Hx    Esophageal cancer Neg Hx    Stomach cancer Neg Hx    Rectal cancer Neg Hx    No current facility-administered medications for this encounter.   Current Outpatient Medications  Medication Sig Dispense Refill   Cholecalciferol (VITAMIN D3 PO) Take 1 tablet by mouth daily at 6 (six) AM.     Coenzyme Q10 (COQ10 PO) Take 1 tablet by mouth daily at 6 (six) AM.     Omega-3 Fatty Acids (OMEGA 3 PO) Take 1 tablet by mouth daily at 6 (six) AM.     rosuvastatin (CRESTOR) 20 MG tablet Take 20 mg by mouth daily.     No Known Allergies Social History   Socioeconomic History   Marital status: Married    Spouse name: Not on file   Number of children: Not on file   Years of education: Not on file   Highest education level: Not on file  Occupational History   Not on file  Tobacco Use   Smoking status: Never   Smokeless tobacco: Never  Vaping Use   Vaping status: Never Used  Substance and Sexual Activity   Alcohol  use: Yes    Alcohol /week: 2.0 - 3.0 standard drinks of alcohol     Types: 2 - 3 Standard drinks or equivalent per week   Drug use: Not Currently   Sexual activity: Not on file  Other Topics Concern   Not on file  Social History Narrative   Not on file   Social Drivers of Health    Financial Resource Strain: Not on file  Food Insecurity: Not on file  Transportation Needs: Not on file  Physical Activity: Not on file  Stress: Not on file  Social Connections: Not on file  Intimate Partner Violence: Not on file    Review of systems: ROS  Physical Exam:  Blood pressure 115/82, pulse 61, temperature 98 F (36.7 C), resp. rate 20, height 5\' 6"  (1.676 m), weight 60 kg, SpO2 96%.   [facility] [place] OS HM.    Pupils:  dilated pharmacologically .    Motility:  OD full ductions  OS full ductions  Balance/alignment:  Ortho by Susen Epstein   Bed side lighted examination and 20D exam.                                  OD  External/adnexa: Normal                                      Lids/lashes:        Normal                                      Conjunctiva        White, quiet        Cornea:              Clear                  AC:                     Deep, quiet                                Iris:                     Normal        Lens:                  Clear                                       OS                                       External/adnexa: Normal                                      Lids/lashes:        Normal                                      Conjunctiva        White, quiet        Cornea:              Clear                  AC:                     Deep, quiet                                Iris:                     Normal        Lens:                  Clear       Dilated fundus exam: (Neo 2.5; Myd 1%)      OD Vitreous            Clear, quiet  Optic Disc:       Normal, perfused                      Macula:             Flat                                            Vessels:           Normal caliber,distribution         Periphery:         Flat, attached                                      OS Vitreous            haze, debris                               Optic Disc:       Normal,  perfused                      Macula:            detached.                                    Vessels:           Normal caliber,distribution         Periphery:        Superior, SN and ST rheg RD.         Labs/studies: No results found for this or any previous visit (from the past 48 hours). No results found.                           Assessment and Plan: Mallory Ramirez is an 48 y.o. female we are asked to see for loss of vision OS with :   -- Retinal detachment OS, Mac off.   Recommend referral to retinal surgeon.  Will help coordinate this for the patient this weekend.  She has my contact info.    All of the above information was relayed to the patient and her husband.    Ophthalmic warning signs and symptoms were reviewed, and clear instructions for immediate phone contact and/or immediate return to the ED or clinic were provided should any of these signs or symptoms occur.  Follow up contact information was provided.  All questions were answered.   Alvina Axon 03/16/2024, 10:38 PM  [facility] [phone]

## 2024-03-16 NOTE — Discharge Instructions (Signed)
 You have been seen and discharged from the emergency department.  You are diagnosed with a retinal detachment.  You were seen by ophthalmology here in the office.  They will be contacting her retinal specialist and reaching out to you in regards to specific follow-up and treatment.  Follow-up with your primary provider for further evaluation and further care. Take home medications as prescribed. If you have any worsening symptoms or further concerns for your health please return to an emergency department for further evaluation.

## 2024-03-19 DIAGNOSIS — H43821 Vitreomacular adhesion, right eye: Secondary | ICD-10-CM | POA: Diagnosis not present

## 2024-03-19 DIAGNOSIS — H33012 Retinal detachment with single break, left eye: Secondary | ICD-10-CM | POA: Diagnosis not present

## 2024-03-19 DIAGNOSIS — H33022 Retinal detachment with multiple breaks, left eye: Secondary | ICD-10-CM | POA: Diagnosis not present

## 2024-03-30 DIAGNOSIS — Z9889 Other specified postprocedural states: Secondary | ICD-10-CM | POA: Diagnosis not present

## 2024-03-30 DIAGNOSIS — H33012 Retinal detachment with single break, left eye: Secondary | ICD-10-CM | POA: Diagnosis not present

## 2024-04-20 DIAGNOSIS — H33012 Retinal detachment with single break, left eye: Secondary | ICD-10-CM | POA: Diagnosis not present

## 2024-05-23 DIAGNOSIS — Z9889 Other specified postprocedural states: Secondary | ICD-10-CM | POA: Diagnosis not present

## 2024-05-23 DIAGNOSIS — H25812 Combined forms of age-related cataract, left eye: Secondary | ICD-10-CM | POA: Diagnosis not present

## 2024-05-23 DIAGNOSIS — H33012 Retinal detachment with single break, left eye: Secondary | ICD-10-CM | POA: Diagnosis not present

## 2024-05-23 DIAGNOSIS — H31092 Other chorioretinal scars, left eye: Secondary | ICD-10-CM | POA: Diagnosis not present

## 2024-06-04 DIAGNOSIS — M79644 Pain in right finger(s): Secondary | ICD-10-CM | POA: Diagnosis not present

## 2024-06-20 DIAGNOSIS — H43821 Vitreomacular adhesion, right eye: Secondary | ICD-10-CM | POA: Diagnosis not present

## 2024-06-20 DIAGNOSIS — H04123 Dry eye syndrome of bilateral lacrimal glands: Secondary | ICD-10-CM | POA: Diagnosis not present

## 2024-06-20 DIAGNOSIS — H2512 Age-related nuclear cataract, left eye: Secondary | ICD-10-CM | POA: Diagnosis not present

## 2024-07-23 DIAGNOSIS — H2512 Age-related nuclear cataract, left eye: Secondary | ICD-10-CM | POA: Diagnosis not present

## 2024-07-23 DIAGNOSIS — H04123 Dry eye syndrome of bilateral lacrimal glands: Secondary | ICD-10-CM | POA: Diagnosis not present

## 2024-07-24 DIAGNOSIS — H1089 Other conjunctivitis: Secondary | ICD-10-CM | POA: Diagnosis not present

## 2024-10-19 DIAGNOSIS — H1089 Other conjunctivitis: Secondary | ICD-10-CM | POA: Diagnosis not present
